# Patient Record
Sex: Female | Born: 1975 | Race: Black or African American | Hispanic: No | Marital: Single | Smoking: Current every day smoker
Health system: Southern US, Community
[De-identification: ages and names within clinical notes are randomized; demographics above are authoritative.]

## PROBLEM LIST (undated history)

## (undated) DIAGNOSIS — F419 Anxiety disorder, unspecified: Secondary | ICD-10-CM

## (undated) DIAGNOSIS — M62838 Other muscle spasm: Secondary | ICD-10-CM

## (undated) DIAGNOSIS — N750 Cyst of Bartholin's gland: Secondary | ICD-10-CM

## (undated) DIAGNOSIS — D649 Anemia, unspecified: Secondary | ICD-10-CM

## (undated) DIAGNOSIS — I10 Essential (primary) hypertension: Secondary | ICD-10-CM

## (undated) DIAGNOSIS — F32A Depression, unspecified: Secondary | ICD-10-CM

## (undated) DIAGNOSIS — K219 Gastro-esophageal reflux disease without esophagitis: Secondary | ICD-10-CM

## (undated) HISTORY — PX: TUBAL LIGATION: SHX77

## (undated) HISTORY — DX: Anemia, unspecified: D64.9

## (undated) HISTORY — DX: Gastro-esophageal reflux disease without esophagitis: K21.9

## (undated) HISTORY — DX: Essential (primary) hypertension: I10

## (undated) HISTORY — DX: Cyst of Bartholin's gland: N75.0

## (undated) HISTORY — DX: Depression, unspecified: F32.A

## (undated) HISTORY — DX: Anxiety disorder, unspecified: F41.9

## (undated) HISTORY — DX: Other muscle spasm: M62.838

---

## 2007-06-04 ENCOUNTER — Emergency Department: Payer: Self-pay | Admitting: General Practice

## 2007-06-08 ENCOUNTER — Ambulatory Visit: Payer: Self-pay | Admitting: Obstetrics and Gynecology

## 2007-06-13 ENCOUNTER — Emergency Department: Payer: Self-pay | Admitting: Emergency Medicine

## 2007-10-22 ENCOUNTER — Emergency Department: Payer: Self-pay | Admitting: Emergency Medicine

## 2008-02-26 ENCOUNTER — Emergency Department: Payer: Self-pay | Admitting: Emergency Medicine

## 2009-06-06 ENCOUNTER — Emergency Department: Payer: Self-pay | Admitting: Emergency Medicine

## 2009-06-24 ENCOUNTER — Emergency Department: Payer: Self-pay | Admitting: Internal Medicine

## 2009-07-07 ENCOUNTER — Ambulatory Visit: Payer: Self-pay | Admitting: Obstetrics and Gynecology

## 2010-12-11 ENCOUNTER — Emergency Department: Payer: Self-pay | Admitting: Emergency Medicine

## 2011-05-21 ENCOUNTER — Emergency Department: Payer: Self-pay | Admitting: Emergency Medicine

## 2011-07-22 ENCOUNTER — Encounter: Payer: Self-pay | Admitting: Obstetrics and Gynecology

## 2011-07-22 ENCOUNTER — Emergency Department: Payer: Self-pay | Admitting: Emergency Medicine

## 2011-09-26 ENCOUNTER — Encounter: Payer: Self-pay | Admitting: Maternal and Fetal Medicine

## 2011-09-26 ENCOUNTER — Observation Stay: Payer: Self-pay | Admitting: Obstetrics and Gynecology

## 2011-12-21 ENCOUNTER — Inpatient Hospital Stay: Payer: Self-pay | Admitting: Obstetrics and Gynecology

## 2011-12-21 LAB — CBC WITH DIFFERENTIAL/PLATELET
Basophil %: 0.4 %
Eosinophil #: 0.1 10*3/uL (ref 0.0–0.7)
Eosinophil %: 0.9 %
HCT: 38.4 % (ref 35.0–47.0)
Lymphocyte #: 2.5 10*3/uL (ref 1.0–3.6)
MCH: 33.5 pg (ref 26.0–34.0)
MCHC: 33.5 g/dL (ref 32.0–36.0)
MCV: 100 fL (ref 80–100)
Monocyte %: 7.5 %
Platelet: 114 10*3/uL — ABNORMAL LOW (ref 150–440)
RDW: 14.6 % — ABNORMAL HIGH (ref 11.5–14.5)
WBC: 10.1 10*3/uL (ref 3.6–11.0)

## 2015-04-09 NOTE — Op Note (Signed)
PATIENT NAMEJULIANNA, [name] MR#:  [id number] DATE OF BIRTH:  01-08-1976  DATE OF PROCEDURE:  12/21/2011  PREOPERATIVE DIAGNOSES:  1. 38.2 week intrauterine pregnancy, undelivered.  2. Advanced maternal age.  3. Increased T21 risk at 1:53.  4. Size greater than dates with borderline hydramnios.  5. Desires elective sterilization.   POSTOPERATIVE DIAGNOSES:  1. 38.2 week intrauterine pregnancy, undelivered.  2. Advanced maternal age.  3. Increased T21 risk at 1:53.  4. Size greater than dates with borderline hydramnios.  5. Desires elective sterilization.  6. Viable female infant, 8 pounds 0 ounces.   OPERATIVE PROCEDURES: Primary low cervical transverse cesarean section with bilateral partial salpingectomy.   SURGEON: Alanda Slim. Zyanna Leisinger, MD  FIRST ASSISTANT: None  ANESTHESIA: Spinal.   INDICATIONS: The patient is a 39 year old African American female gravida 5 para 1-0-3-1, at 38.[redacted] weeks gestation who presented in labor. Pitocin augmentation was performed; amniotomy and internal monitoring with IUPC and FSE was accomplished. Patient had arrest of dilation at 5 cm and -2 station. Patient underwent primary low cervical transverse cesarean section for arrest of dilation and for desire of elective sterilization procedure.   FINDINGS AT SURGERY: Viable female infant 8 pounds 0 ounces having Apgars of 8 and 9 at one and five minutes, respectively. Infant was vigorous at birth. Uterus, tubes, and ovaries were grossly normal. There were a few adhesions between the left adnexa and uterine fundus that were lysed.   DESCRIPTION OF PROCEDURE: Patient was brought to the Operating Room where she was placed in the supine position with a right lateral hip roll in place. The epidural anesthetic was optimized prior to placement on the OR table. Foley catheter was draining clear yellow urine from the bladder. A ChloraPrep abdominal and perineal prep and drape was performed in the standard fashion. After  checking for adequate level of anesthesia, a Pfannenstiel incision was made in the lower abdomen. The fascia was incised with scalpel. The midline raphe was incised, separated and peritoneum was entered. Bladder flap was created over the lower uterine segment through sharp dissection. Low transverse incision was made in the uterus and was extended bluntly bilaterally. The infant was delivered through a vertex presentation and was noted to be vigorous at birth. The umbilical cord was clamped and cut and the infant was handed off to the awaiting resuscitation team. Cord blood sampling was obtained. Placenta was expressed in standard fashion. Uterus was externalized and cleared of all debris with laps. The incision was closed in two layers with #1 chromic suture for the first layer was a running locking stitch, second layer was an imbricating layer. The bilateral partial salpingectomy was then performed in routine fashion. The Babcock clamp was used to grasp the midsegment portion of the tube. A free tie of 0 plain was placed around the tube segment. A stick tie was then placed likewise around the tube segment. The intervening tube segment was resected. Good hemostasis was noted. A similar procedure was carried on the contralateral tube. Upon final completion of the procedure the uterus was placed back into the abdominopelvic cavity. The incision was closed in layers using 0 Maxon on the fascia in a simple running manner. The skin was closed with staples. Pressure dressing was applied. The patient was then mobilized and taken to the recovery room in satisfactory condition. Estimated blood loss was 500 mL. All instruments, needle and sponge counts were verified as correct. Patient did receive Ancef 2 grams antibiotic prophylaxis.  ____________________________ Alanda Slim  Prestyn Mahn, MD mad:cms D: 12/21/2011 22:49:33 ET T: 12/22/2011 14:36:35 ET JOB#: [id number]  cc: Hassell Done A. Kielan Dreisbach, MD, <Dictator> Alanda Slim  Aspin Palomarez MD ELECTRONICALLY SIGNED 01/13/2012 12:41
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
## 2015-05-03 ENCOUNTER — Emergency Department
Admission: EM | Admit: 2015-05-03 | Discharge: 2015-05-03 | Disposition: A | Discharge status: Home / Self Care | Payer: Managed Care, Other (non HMO) | Attending: Student | Admitting: Student

## 2015-05-03 ENCOUNTER — Emergency Department: Payer: Managed Care, Other (non HMO)

## 2015-05-03 DIAGNOSIS — T148 Other injury of unspecified body region: Secondary | ICD-10-CM | POA: Insufficient documentation

## 2015-05-03 DIAGNOSIS — Y9301 Activity, walking, marching and hiking: Secondary | ICD-10-CM | POA: Insufficient documentation

## 2015-05-03 DIAGNOSIS — W010XXA Fall on same level from slipping, tripping and stumbling without subsequent striking against object, initial encounter: Secondary | ICD-10-CM | POA: Diagnosis not present

## 2015-05-03 DIAGNOSIS — S8992XA Unspecified injury of left lower leg, initial encounter: Secondary | ICD-10-CM | POA: Diagnosis not present

## 2015-05-03 DIAGNOSIS — S6992XA Unspecified injury of left wrist, hand and finger(s), initial encounter: Secondary | ICD-10-CM | POA: Insufficient documentation

## 2015-05-03 DIAGNOSIS — Z720 Tobacco use: Secondary | ICD-10-CM | POA: Diagnosis not present

## 2015-05-03 DIAGNOSIS — S8991XA Unspecified injury of right lower leg, initial encounter: Secondary | ICD-10-CM | POA: Diagnosis not present

## 2015-05-03 DIAGNOSIS — M25551 Pain in right hip: Secondary | ICD-10-CM

## 2015-05-03 DIAGNOSIS — S76011A Strain of muscle, fascia and tendon of right hip, initial encounter: Secondary | ICD-10-CM | POA: Diagnosis not present

## 2015-05-03 DIAGNOSIS — Y9289 Other specified places as the place of occurrence of the external cause: Secondary | ICD-10-CM | POA: Insufficient documentation

## 2015-05-03 DIAGNOSIS — T148XXA Other injury of unspecified body region, initial encounter: Secondary | ICD-10-CM

## 2015-05-03 DIAGNOSIS — S79911A Unspecified injury of right hip, initial encounter: Secondary | ICD-10-CM | POA: Diagnosis present

## 2015-05-03 DIAGNOSIS — Y998 Other external cause status: Secondary | ICD-10-CM | POA: Diagnosis not present

## 2015-05-03 MED ORDER — CYCLOBENZAPRINE HCL 5 MG PO TABS: 5.0000 mg | ORAL_TABLET | Freq: Three times a day (TID) | ORAL | Status: DC | PRN

## 2015-05-03 MED ORDER — IBUPROFEN 200 MG PO TABS
400.0000 mg | ORAL_TABLET | Freq: Three times a day (TID) | ORAL | Status: DC | PRN
Start: 2015-05-03 — End: 2015-10-24

## 2015-05-03 NOTE — ED Notes (Signed)
Fall X 3 days ago, right leg pain since, progressively worsening. Did not hit head in fall. Pt alert and oriented X4, active, cooperative, pt in NAD. RR even and unlabored, color WNL.

## 2015-05-03 NOTE — Discharge Instructions (Signed)
Take medication as prescribed. Alternate ice and heat for comfort. Stretch well multiple times per day.  Follow-up with primary care physician next week for the above.  Return to the ER for new or worsening concerns.  Hip Pain Your hip is the joint between your upper legs and your lower pelvis. The bones, cartilage, tendons, and muscles of your hip joint perform a lot of work each day supporting your body weight and allowing you to move around. Hip pain can range from a minor ache to severe pain in one or both of your hips. Pain may be felt on the inside of the hip joint near the groin, or the outside near the buttocks and upper thigh. You may have swelling or stiffness as well.  HOME CARE INSTRUCTIONS   Take medicines only as directed by your health care provider.  Apply ice to the injured area:  Put ice in a plastic bag.  Place a towel between your skin and the bag.  Leave the ice on for 15-20 minutes at a time, 3-4 times a day.  Keep your leg raised (elevated) when possible to lessen swelling.  Avoid activities that cause pain.  Follow specific exercises as directed by your health care provider.  Sleep with a pillow between your legs on your most comfortable side.  Record how often you have hip pain, the location of the pain, and what it feels like. SEEK MEDICAL CARE IF:   You are unable to put weight on your leg.  Your hip is red or swollen or very tender to touch.  Your pain or swelling continues or worsens after 1 week.  You have increasing difficulty walking.  You have a fever. SEEK IMMEDIATE MEDICAL CARE IF:   You have fallen.  You have a sudden increase in pain and swelling in your hip. MAKE SURE YOU:   Understand these instructions.  Will watch your condition.  Will get help right away if you are not doing well or get worse. Document Released: 05/22/2010 Document Revised: 04/18/2014 Document Reviewed: 07/29/2013 [facility] Patient Information 2015  [place], [place]. This information is not intended to replace advice given to you by your health care provider. Make sure you discuss any questions you have with your health care provider.  Gluteus Medius Syndrome with Rehab Gluteus medius syndrome is a condition that causes pain and inflammation on the outer portion of the hip. Gluteus medius syndrome is caused by a muscle tear (strain) in the muscle or tendon in the gluteus medius muscle. The gluteus medius muscle is responsible for moving the thigh away from the other thigh (abducting), as well as stabilizing the hip while walking, running, and jumping. Gluteus medius syndrome usually involves a grade 1 or 2 strain of the muscle or tendon. Grade 1 strains cause pain, but the tendon is not lengthened. Grade 2 strains include a lengthened ligament due to the ligament being stretched or partially ruptured. With grade 2 strains there is still function, although the function may be decreased.  SYMPTOMS   Pain, and often a limp, with walking or running.  Tenderness over the outer hip.  Pain, tenderness, swelling, warmth, or redness over the outer thigh, often worsened by moving the hip.  Often, weakness of the hip.  Pain or weakness that gets worse when outwardly moving the thigh. CAUSES  Gluteus medius syndrome may be caused by either a severe (acute) or ongoing (chronic) injury. These injuries are often due to a sudden increase in the intensity, frequency,  or duration of training. Typically, this condition is associated with tilting of the pelvis with running.  RISK INCREASES WITH:  Endurance sports (distance running, triathlons, or race walking), especially running along street curbs and/or banked surfaces. Or if the foot crosses the midline toward the other leg when running.  Poor strength and flexibility.  Failure to warm up properly before activity.  Legs of unequal length (affects longer leg).  Alignment problems of the lower legs,  including wide pelvis and excessively knocked knees. PREVENTION   Warm up and stretch properly before activity.  Maintain physical fitness:  Strength, flexibility, and endurance.  Cardiovascular fitness.  Learn and use proper running technique.  Wear shoe lifts (orthotics) if legs are not equal in length. PROGNOSIS  If treated properly, gluteus medius syndrome usually heals within 2 to 6 weeks.  RELATED COMPLICATIONS   Prolonged healing time, if not properly treated, or if not given adequate time to heal.  Chronically inflamed tendon, causing persistent pain with activity that may progress to constant pain.  Recurring symptoms if activity is resumed too soon, with overuse, with a direct blow, or if using poor technique. TREATMENT  Treatment first involves the use of ice and medicine to help reduce pain and inflammation. It is important to complete strengthening and stretching exercises, as well as modify any activity that aggravates symptoms. These exercises may be completed at home or with a therapist. For people with legs that are unequal in length, a shoe lift (orthotic) may be recommended. Rarely, surgery is needed and is only considered after more than 6 months of unsuccessful nonsurgical treatment. MEDICATION  If pain medicine is needed, nonsteroidal anti-inflammatory medications (aspirin and ibuprofen), or other minor pain relievers (acetaminophen), are often recommended.  Do not take pain medicine for 7 days before surgery.  Prescription pain relievers may be given if your caregiver thinks they are needed. Use only as directed and only as much as you need.  Corticosteroid injections may be recommended. However, these injections should only be used for serious cases, as they can only be given a certain number of times. HEAT AND COLD  Cold treatment (icing) relieves pain and reduces inflammation. Cold treatment should be applied for 10 to 15 minutes every 2 to 3 hours, and  immediately after activity that aggravates your symptoms. Use ice packs or an ice massage.  Heat treatment may be used before performing the stretching and strengthening activities prescribed by your caregiver, physical therapist, or athletic trainer. Use a heat pack or a warm water soak. SEEK MEDICAL CARE IF:  Symptoms get worse or do not improve in 2 weeks, despite treatment.  New, unexplained symptoms develop. (Drugs used in treatment may produce side effects.) EXERCISES  RANGE OF MOTION (ROM) AND STRETCHING EXERCISES - Gluteus Medius Syndrome These exercises may help you when beginning to rehabilitate your injury. Your symptoms may go away with or without further involvement from your physician, physical therapist, or athletic trainer. While completing these exercises, remember:   Restoring tissue flexibility helps normal motion to return to the joints. This allows healthier, less painful movement and activity.  An effective stretch should be held for at least 30 seconds.  A stretch should never be painful. You should only feel a gentle lengthening or release in the stretched tissue. STRETCH - Hip Rotators   Lie on your back on a firm surface. Grasp your right / left knee with your right / left hand and your ankle with your opposite hand.  Keeping  your hips and shoulders firmly planted, gently pull your right / left knee, and rotate your lower leg toward your opposite shoulder until you feel a stretch in your buttocks.  Hold this stretch for __________ seconds. Repeat this stretch __________ times. Complete this stretch __________ times per day. STRETCH - Iliotibial Band  On the floor or bed, lie on your side so your right / left leg is on top. Bend your knee and grab your ankle.  Slowly bring your knee back, so that your thigh is in line with your trunk. Keep your heel at your buttocks and gently arch your back so your head, shoulders and hips line up.  Slowly lower your leg so  that your knee approaches the floor or bed, until you feel a gentle stretch on the outside of your right / left thigh. If you do not feel a stretch and your knee will not fall farther, place the heel of your opposite foot on top of your knee and pull your thigh down farther.  Hold this stretch for __________ seconds. Repeat __________ times. Complete __________ times per day. STRENGTHENING EXERCISES - Gluteus Medius Syndrome These exercises may help you when beginning to rehabilitate your injury. They may resolve your symptoms with or without further involvement from your physician, physical therapist, or athletic trainer. While completing these exercises, remember:   Muscles can gain both the endurance and the strength needed for everyday activities through controlled exercises.  Complete these exercises as instructed by your physician, physical therapist, or athletic trainer. Increase the resistance and repetitions only as guided.  You may experience muscle soreness or fatigue, but the pain or discomfort you are trying to eliminate should never worsen during these exercises. If this pain does get worse, stop and make certain you are following the directions exactly. If the pain is still present after adjustments, discontinue the exercise until you can discuss the trouble with your clinician. STRENGTH - Hip Extensors, Bridge   Lie on your back on a firm surface. Bend your knees and place your feet flat on the floor.  Tighten your buttocks muscles and lift your bottom off the floor, until your trunk is level with your thighs. You should feel the muscles in your buttocks and the back of your thighs working. If you do not feel these muscles, slide your feet 1-2 inches further away from your buttocks.  Hold this position for __________ seconds.  Slowly lower your hips to the starting position, and allow your buttock muscles to relax completely before beginning the next repetition.  If this  exercise is too easy, you may cross your arms over your chest. Repeat __________ times. Complete this exercise __________ times per day.  STRENGTH - Hip Abductors, Straight Leg Raises  Be aware of your form throughout the entire exercise, so that you exercise the correct muscles. Poor form means that you are not strengthening the correct muscles.  Lie on your side so that your head, shoulders, knee, and hip line up. You may bend your lower knee to help maintain your balance. Your right / left leg should be on top.  Roll your hips slightly forward, so that your hips are stacked directly over each other and your right / left knee is facing forward.  Lift your top leg up 4-6 inches, leading with your heel. Be sure that your foot does not drift forward and that your knee does not roll toward the ceiling.  Hold this position for __________ seconds. You should  feel the muscles in your outer hip lifting (you may not notice this until your leg begins to tire).  Slowly lower your leg to the starting position. Allow the muscles to fully relax before beginning the next repetition. Repeat __________ times. Complete this exercise __________ times per day.  STRENGTH - Hip Abductors, Quadruped  On a firm, lightly padded surface, position yourself on your hands and knees. Your hands should be directly below your shoulders and your knees should be directly below your hips.  Keeping your right / left knee bent, lift your leg out to the side. Keep your legs level and in line with your shoulders.  Hold for __________ seconds.  Keeping your trunk steady and your hips level, slowly lower your leg to the starting position. Repeat __________ times. Complete this exercise __________ times per day.  STRENGTH - Hip Abductors, Standing Be aware of your form throughout the entire exercise, so that you exercise the correct muscles. Poor form means that you are not strengthening the correct muscles.  Tie one end of a  rubber exercise band or tubing to a secure surface (table, pole) and tie a loop at the other end.  Place the loop around your right / left ankle. Turn your body sideways so that your opposite side faces the table or pole and your right / left leg away from the table or pole. Step away from the pole or table, until there is tension in the band.  Hold onto a chair, as needed, for balance.  Keeping your back upright, your shoulders over your hips, and your toes pointing forward, lift your right / left leg out to your side. Be sure to lift your leg with your hip muscles. Do not "throw" your leg or tip your body to lift your leg.  Slowly and with control, return to the starting position. Repeat exercise __________ times. Complete this exercise __________ times per day.  Document Released: 12/02/2005 Document Revised: 04/18/2014 Document Reviewed: 03/16/2009 [facility] Patient Information 2015 [place], [place]. This information is not intended to replace advice given to you by your health care provider. Make sure you discuss any questions you have with your health care provider.

## 2015-05-03 NOTE — ED Provider Notes (Signed)
[facility] Emergency Department Provider Note ____________________________________________  Time seen: Approximately 1200 PM  I have reviewed the triage vital signs and the nursing notes.   HISTORY  Chief Complaint Fall and Leg Pain    HPI Carrie [name] is a 39 y.o. female presents to the ER for right hip pain. Patient states that 3 days ago she was walking at her mom's house went to step up on the curb and tripped over the curb edge.  states she then fell forward and caught herself with left hand and fell onto the left knee. patient denies pain to the left hand or left knee but reports since fall she has had pain to right hip. Denies head injury or loss of consciousness. Denies neck pain or back pain.   Patient reports right hip pain as 5 out of 10 aching and feels like a pulled muscle. States movement increases pain. States when at rest and lying on her back she has minimal pain. Patient reports movement of right leg increases pain. Denies any other fall or injury.   History reviewed. No pertinent past medical history.  There are no active problems to display for this patient.   Past Surgical History  Procedure Laterality Date  . Cesarean section      No current outpatient prescriptions on file.  Allergies Review of patient's allergies indicates no known allergies.  No family history on file.  Social History History  Substance Use Topics  . Smoking status: Current Every Day Smoker  . Smokeless tobacco: Not on file  . Alcohol Use: No    Review of Systems Constitutional: No fever/chills Eyes: No visual changes. ENT: No sore throat. Cardiovascular: Denies chest pain. Respiratory: Denies shortness of breath. Gastrointestinal: No abdominal pain.  No nausea, no vomiting.  No diarrhea.  No constipation. Genitourinary: Negative for dysuria. Musculoskeletal: Negative for back pain.positive for right hip pain as above Skin: Negative for  rash. Neurological: Negative for headaches, focal weakness or numbness.  10-point ROS otherwise negative.  ____________________________________________   PHYSICAL EXAM:  VITAL SIGNS: ED Triage Vitals  Enc Vitals Group     BP 05/03/15 1124 132/97 mmHg     Pulse Rate 05/03/15 1124 91     Resp 05/03/15 1124 16     Temp 05/03/15 1124 98.5 F (36.9 C)     Temp Source 05/03/15 1124 Oral     SpO2 05/03/15 1124 100 %     Weight 05/03/15 1124 100 lb (45.36 kg)     Height 05/03/15 1124 5' (1.524 m)     Head Cir --      Peak Flow --      Pain Score 05/03/15 1124 8     Pain Loc --      Pain Edu? --      Excl. in [place]? --     Constitutional: Alert and oriented. Well appearing and in no acute distress. Eyes: Conjunctivae are normal. PERRL. EOMI. Head: Atraumatic. Nose: No congestion/rhinnorhea. Mouth/Throat: Mucous membranes are moist.  Oropharynx non-erythematous. Neck: No stridor.  No cervical spine tenderness to palpation. Hematological/Lymphatic/Immunilogical: No cervical lymphadenopathy. Cardiovascular: Normal rate, regular rhythm. Grossly normal heart sounds.  Good peripheral circulation. Respiratory: Normal respiratory effort.  No retractions. Lungs CTAB. Gastrointestinal: Soft and nontender. No distention. No abdominal bruits. No CVA tenderness. Musculoskeletal: No lower extremity tenderness nor edema.  No joint effusions. Except: Right lateral hip and lateral gluteus mild TTP. Pain increased with palpation and right hip abduction. No swelling,  erythema, ecchymosis.  Neurologic:  Normal speech and language. No gross focal neurologic deficits are appreciated. Speech is normal. No gait instability. Skin:  Skin is warm, dry and intact. No rash noted. Psychiatric: Mood and affect are normal. Speech and behavior are normal.  ____________________________________________   LABS (all labs ordered are listed, but only abnormal results are displayed)  Labs Reviewed - No data to  display ____________________________________________   RADIOLOGY  RIGHT HIP (WITH PELVIS) 2-3 VIEWS  COMPARISON: None.  FINDINGS: There is no evidence of hip fracture or dislocation. There is no evidence of arthropathy or other focal bone abnormality.  IMPRESSION: No acute abnormality noted.   Electronically Signed By: Inez Catalina M.D. On: 05/03/2015 13:14 ____________________________________________   INITIAL IMPRESSION / ASSESSMENT AND PLAN / ED COURSE  Pertinent labs & imaging results that were available during my care of the patient were reviewed by me and considered in my medical decision making (see chart for details).  Very well-appearing patient. Changes positions quickly from lying to standing. No acute distress. Steady gait. Complains of right lateral pain at [place] with movement. Recent fall but not directly in this area. X-ray negative for acute bony abnormality. Patient with point muscular tenderness. Suspect muscular strain. Will treat with anti-inflammatories and as needed muscle relaxants. Stretching. Patient followed primary care physician as needed discussed above and return parameters.Pt agreed to plan.  ____________________________________________   FINAL CLINICAL IMPRESSION(S) / ED DIAGNOSES  Final diagnoses:  Right hip pain  Muscle strain      Marylene Land, NP 05/03/15 1357  Joanne Gavel, MD 05/03/15 1447

## 2015-06-30 ENCOUNTER — Emergency Department
Admission: EM | Admit: 2015-06-30 | Discharge: 2015-06-30 | Disposition: A | Discharge status: Home / Self Care | Payer: Managed Care, Other (non HMO) | Attending: Emergency Medicine | Admitting: Emergency Medicine

## 2015-06-30 ENCOUNTER — Encounter: Payer: Self-pay | Admitting: *Deleted

## 2015-06-30 DIAGNOSIS — N751 Abscess of Bartholin's gland: Secondary | ICD-10-CM | POA: Insufficient documentation

## 2015-06-30 DIAGNOSIS — Z720 Tobacco use: Secondary | ICD-10-CM | POA: Diagnosis not present

## 2015-06-30 DIAGNOSIS — R102 Pelvic and perineal pain: Secondary | ICD-10-CM | POA: Diagnosis present

## 2015-06-30 MED ORDER — CLINDAMYCIN HCL 300 MG PO CAPS: 300.0000 mg | ORAL_CAPSULE | Freq: Three times a day (TID) | ORAL | Status: AC

## 2015-06-30 MED ORDER — OXYCODONE-ACETAMINOPHEN 5-325 MG PO TABS
1.0000 | ORAL_TABLET | Freq: Once | ORAL | Status: AC
  Administered 2015-06-30: 1 via ORAL
  Filled 2015-06-30: qty 1

## 2015-06-30 MED ORDER — LIDOCAINE HCL (PF) 1 % IJ SOLN
INTRAMUSCULAR | Status: AC
  Filled 2015-06-30: qty 5

## 2015-06-30 MED ORDER — KETOROLAC TROMETHAMINE 30 MG/ML IJ SOLN: 30.0000 mg | Freq: Once | INTRAMUSCULAR | Status: DC

## 2015-06-30 MED ORDER — OXYCODONE-ACETAMINOPHEN 5-325 MG PO TABS: 1.0000 | ORAL_TABLET | ORAL | Status: DC | PRN

## 2015-06-30 MED ORDER — CLINDAMYCIN HCL 150 MG PO CAPS
300.0000 mg | ORAL_CAPSULE | Freq: Once | ORAL | Status: AC
  Administered 2015-06-30: 300 mg via ORAL
  Filled 2015-06-30: qty 2

## 2015-06-30 NOTE — ED Notes (Signed)
Called pharmacy to send pt medication. 

## 2015-06-30 NOTE — ED Notes (Signed)
Pt reports she has an abscess on her left labial area she noticed about 2 days ago, denies drainage, states low grade temp and cold chills.

## 2015-06-30 NOTE — ED Provider Notes (Signed)
[facility] Emergency Department Provider Note  ____________________________________________  Time seen: 2:30 AM  I have reviewed the triage vital signs and the nursing notes.   HISTORY  Chief Complaint Abscess      HPI Carrie [name] is a 39 y.o. female resents with "abscess site her vagina". Patient stated she noted a tender swollen area to the left portion of her vagina 2 days. Patient also admits to a low-grade ejected fever and chills at home. Patient admits to previous episodes of Bartholin's cyst/abscess 2 occasions     Past medical history Bartholin's abscess   Past Surgical History  Procedure Laterality Date  . Cesarean section      Current Outpatient Rx  Name  Route  Sig  Dispense  Refill  . cyclobenzaprine (FLEXERIL) 5 MG tablet   Oral   Take 1 tablet (5 mg total) by mouth every 8 (eight) hours as needed for muscle spasms (or pain. Do not drive or operate machinery while taking as can cause drowsiness.).   12 tablet   0   . ibuprofen (MOTRIN IB) 200 MG tablet   Oral   Take 2 tablets (400 mg total) by mouth every 8 (eight) hours as needed for mild pain or moderate pain.   15 tablet   0     Allergies Review of patient's allergies indicates no known allergies.  No family history on file.  Social History History  Substance Use Topics  . Smoking status: Current Every Day Smoker  . Smokeless tobacco: Not on file  . Alcohol Use: No    Review of Systems  Constitutional: Negative for fever. Eyes: Negative for visual changes. ENT: Negative for sore throat. Cardiovascular: Negative for chest pain. Respiratory: Negative for shortness of breath. Gastrointestinal: Negative for abdominal pain, vomiting and diarrhea. Genitourinary: Negative for dysuria. Musculoskeletal: Negative for back pain. Skin: Positive for abscess beside vagina Neurological: Negative for headaches, focal weakness or numbness.   10-point ROS  otherwise negative.  ____________________________________________   PHYSICAL EXAM:  VITAL SIGNS: ED Triage Vitals  Enc Vitals Group     BP 06/30/15 0102 119/79 mmHg     Pulse Rate 06/30/15 0102 77     Resp 06/30/15 0102 16     Temp 06/30/15 0102 98.1 F (36.7 C)     Temp Source 06/30/15 0102 Oral     SpO2 06/30/15 0102 100 %     Weight 06/30/15 0102 98 lb (44.453 kg)     Height 06/30/15 0102 5' (1.524 m)     Head Cir --      Peak Flow --      Pain Score 06/30/15 0102 8     Pain Loc --      Pain Edu? --      Excl. in [place]? --    Constitutional: Alert and oriented. Well appearing and in no distress. Eyes: Conjunctivae are normal. PERRL. Normal extraocular movements. ENT   Head: Normocephalic and atraumatic.   Nose: No congestion/rhinnorhea.   Mouth/Throat: Mucous membranes are moist.   Neck: No stridor. Cardiovascular: Normal rate, regular rhythm. Normal and symmetric distal pulses are present in all extremities. No murmurs, rubs, or gallops. Respiratory: Normal respiratory effort without tachypnea nor retractions. Breath sounds are clear and equal bilaterally. No wheezes/rales/rhonchi. Gastrointestinal: Soft and nontender. No distention. There is no CVA tenderness. Genitourinary: Tender swollen area noted in the vicinity of the Bartholin's gland on the left consistent with Bartholin abscess. Musculoskeletal: Nontender with normal range of  motion in all extremities. No joint effusions.  No lower extremity tenderness nor edema. Neurologic:  Normal speech and language. No gross focal neurologic deficits are appreciated. Speech is normal.  Skin:  Skin is warm, dry and intact. No rash noted. Psychiatric: Mood and affect are normal. Speech and behavior are normal. Patient exhibits appropriate insight and judgment.  ____________________________________________   Procedure note: INCISION AND DRAINAGE Performed by: Gregor Hams Consent: Verbal consent  obtained. Risks and benefits: risks, benefits and alternatives were discussed Type: abscess  Body area: left vaginal region (Bartholin's gland (  Anesthesia: local infiltration  Incision was made with a scalpel.  Local anesthetic: lidocaine 1 %   Anesthetic total:64ml  Complexity: complex Blunt dissection to break up loculations  Drainage: purulent  Drainage amount: 10 cc   Packing material: 1/4 in iodoform gauze  Patient tolerance: Patient tolerated the procedure well with no immediate complications.    INITIAL IMPRESSION / ASSESSMENT AND PLAN / ED COURSE  Pertinent labs & imaging results that were available during my care of the patient were reviewed by me and considered in my medical decision making (see chart for details).    history of physical exam consistent with Bartholin gland abscess as such I&D was performed with approximately 10 ML's of pus expressed. ____________________________________________   FINAL CLINICAL IMPRESSION(S) / ED DIAGNOSES  Final diagnoses:  Bartholin's gland abscess      Gregor Hams, MD 07/04/15 [phone]

## 2015-06-30 NOTE — Discharge Instructions (Signed)
Bartholin's Cyst or Abscess °Bartholin's glands are small glands located within the folds of skin (labia) along the sides of the lower opening of the vagina (birth canal). A cyst may develop when the duct of the gland becomes blocked. When this happens, fluid that accumulates within the cyst can become infected. This is known as an abscess. The Bartholin gland produces a mucous fluid to lubricate the outside of the vagina during sexual intercourse. °SYMPTOMS  °· Patients with a small cyst may not have any symptoms. °· Mild discomfort to severe pain depending on the size of the cyst and if it is infected (abscess). °· Pain, redness, and swelling around the lower opening of the vagina. °· Painful intercourse. °· Pressure in the perineal area. °· Swelling of the lips of the vagina (labia). °· The cyst or abscess can be on one side or both sides of the vagina. °DIAGNOSIS  °· A large swelling is seen in the lower vagina area by your caregiver. °· Painful to touch. °· Redness and pain, if it is an abscess. °TREATMENT  °· Sometimes the cyst will go away on its own. °· Apply warm wet compresses to the area or take hot sitz baths several times a day. °· An incision to drain the cyst or abscess with local anesthesia. °· Culture the pus, if it is an abscess. °· Antibiotic treatment, if it is an abscess. °· Cut open the gland and suture the edges to make the opening of the gland bigger (marsupialization). °· Remove the whole gland if the cyst or abscess returns. °PREVENTION  °· Practice good hygiene. °· Clean the vaginal area with a mild soap and soft cloth when bathing. °· Do not rub hard in the vaginal area when bathing. °· Protect the crotch area with a padded cushion if you take long bike rides or ride horses. °· Be sure you are well lubricated when you have sexual intercourse. °HOME CARE INSTRUCTIONS  °· If your cyst or abscess was opened, a small piece of gauze, or a drain, may have been placed in the wound to allow  drainage. Do not remove this gauze or drain unless directed by your caregiver. °· Wear feminine pads, not tampons, as needed for any drainage or bleeding. °· If antibiotics were prescribed, take them exactly as directed. Finish the entire course. °· Only take over-the-counter or prescription medicines for pain, discomfort, or fever as directed by your caregiver. °SEEK IMMEDIATE MEDICAL CARE IF:  °· You have an increase in pain, redness, swelling, or drainage. °· You have bleeding from the wound which results in the use of more than the number of pads suggested by your caregiver in 24 hours. °· You have chills. °· You have a fever. °· You develop any new problems (symptoms) or aggravation of your existing condition. °MAKE SURE YOU:  °· Understand these instructions. °· Will watch your condition. °· Will get help right away if you are not doing well or get worse. °Document Released: 12/02/2005 Document Revised: 02/24/2012 Document Reviewed: 07/20/2008 °ExitCare® Patient Information ©2015 [employer]. This information is not intended to replace advice given to you by your health care provider. Make sure you discuss any questions you have with your health care provider. ° °

## 2015-08-25 ENCOUNTER — Emergency Department
Admission: EM | Admit: 2015-08-25 | Discharge: 2015-08-25 | Disposition: A | Discharge status: Home / Self Care | Payer: Managed Care, Other (non HMO) | Attending: Student | Admitting: Student

## 2015-08-25 DIAGNOSIS — N764 Abscess of vulva: Secondary | ICD-10-CM | POA: Diagnosis not present

## 2015-08-25 DIAGNOSIS — Z9889 Other specified postprocedural states: Secondary | ICD-10-CM

## 2015-08-25 DIAGNOSIS — Z720 Tobacco use: Secondary | ICD-10-CM | POA: Insufficient documentation

## 2015-08-25 DIAGNOSIS — L729 Follicular cyst of the skin and subcutaneous tissue, unspecified: Secondary | ICD-10-CM | POA: Diagnosis not present

## 2015-08-25 MED ORDER — CLINDAMYCIN HCL 150 MG PO CAPS
300.0000 mg | ORAL_CAPSULE | Freq: Once | ORAL | Status: AC
  Administered 2015-08-25: 300 mg via ORAL
  Filled 2015-08-25: qty 2

## 2015-08-25 MED ORDER — CLINDAMYCIN HCL 300 MG PO CAPS: 300.0000 mg | ORAL_CAPSULE | Freq: Four times a day (QID) | ORAL | Status: DC

## 2015-08-25 MED ORDER — TRAMADOL HCL 50 MG PO TABS: 50.0000 mg | ORAL_TABLET | Freq: Two times a day (BID) | ORAL | Status: DC

## 2015-08-25 MED ORDER — TRAMADOL HCL 50 MG PO TABS
50.0000 mg | ORAL_TABLET | Freq: Once | ORAL | Status: AC
  Administered 2015-08-25: 50 mg via ORAL
  Filled 2015-08-25: qty 1

## 2015-08-25 MED ORDER — LIDOCAINE-EPINEPHRINE (PF) 1 %-1:200000 IJ SOLN
30.0000 mL | Freq: Once | INTRAMUSCULAR | Status: AC
  Administered 2015-08-25: 30 mL via INTRADERMAL
  Filled 2015-08-25: qty 30

## 2015-08-25 NOTE — ED Provider Notes (Signed)
[facility] Emergency Department Provider Note ____________________________________________  Time seen: 2045  I have reviewed the triage vital signs and the nursing notes.  HISTORY  Chief Complaint  Cyst  HPI Carrie [name] is a 39 y.o. female reports to the ED for eval which management of a vulvar abscess is been present for the last 3 days. She is been applying warm compresses to help promote healing. She has previously experienced or abscesses that required incision and drainage. She denies any current fevers, chills, or sweats. She is scheduled to see her provider at [facility] when she returns from out of town next week.  No past medical history on file.  There are no active problems to display for this patient.   Past Surgical History  Procedure Laterality Date  . Cesarean section      Current Outpatient Rx  Name  Route  Sig  Dispense  Refill  . clindamycin (CLEOCIN) 300 MG capsule   Oral   Take 1 capsule (300 mg total) by mouth 4 (four) times daily.   40 capsule   0   . cyclobenzaprine (FLEXERIL) 5 MG tablet   Oral   Take 1 tablet (5 mg total) by mouth every 8 (eight) hours as needed for muscle spasms (or pain. Do not drive or operate machinery while taking as can cause drowsiness.).   12 tablet   0   . ibuprofen (MOTRIN IB) 200 MG tablet   Oral   Take 2 tablets (400 mg total) by mouth every 8 (eight) hours as needed for mild pain or moderate pain.   15 tablet   0   . oxyCODONE-acetaminophen (PERCOCET/ROXICET) 5-325 MG per tablet   Oral   Take 1 tablet by mouth every 4 (four) hours as needed for severe pain.   20 tablet   0   . traMADol (ULTRAM) 50 MG tablet   Oral   Take 1 tablet (50 mg total) by mouth 2 (two) times daily.   10 tablet   0    Allergies Review of patient's allergies indicates no known allergies.  No family history on file.  Social History Social History  Substance Use Topics  . Smoking status:  Current Every Day Smoker  . Smokeless tobacco: Not on file  . Alcohol Use: No   Review of Systems  Constitutional: Negative for fever. Eyes: Negative for visual changes. ENT: Negative for sore throat. Cardiovascular: Negative for chest pain. Respiratory: Negative for shortness of breath. Gastrointestinal: Negative for abdominal pain, vomiting and diarrhea. Genitourinary: Negative for dysuria. Vulvar abscess as above Musculoskeletal: Negative for back pain. Skin: Negative for rash. Neurological: Negative for headaches, focal weakness or numbness. ____________________________________________  PHYSICAL EXAM:  VITAL SIGNS: ED Triage Vitals  Enc Vitals Group     BP 08/25/15 1914 113/79 mmHg     Pulse Rate 08/25/15 1914 81     Resp 08/25/15 1914 18     Temp 08/25/15 1914 97.4 F (36.3 C)     Temp Source 08/25/15 1914 Oral     SpO2 08/25/15 1914 100 %     Weight 08/25/15 1914 97 lb (43.999 kg)     Height 08/25/15 1914 5' (1.524 m)     Head Cir --      Peak Flow --      Pain Score 08/25/15 1916 8     Pain Loc --      Pain Edu? --      Excl. in [place]? --  Constitutional: Alert and oriented. Well appearing and in no distress. Eyes: Conjunctivae are normal. PERRL. Normal extraocular movements. ENT   Head: Normocephalic and atraumatic.   Nose: No congestion/rhinorrhea.   Mouth/Throat: Mucous membranes are moist.   Neck: Supple. No thyromegaly. Hematological/Lymphatic/Immunological: No cervical lymphadenopathy. Cardiovascular: Normal rate, regular rhythm.  Respiratory: Normal respiratory effort. No wheezes/rales/rhonchi. Gastrointestinal: Soft and nontender. No distention. GU:  Musculoskeletal: Nontender with normal range of motion in all extremities.  Neurologic:  Normal gait without ataxia. Normal speech and language. No gross focal neurologic deficits are appreciated. Skin:  Skin is warm, dry and intact. No rash noted. Psychiatric: Mood and affect are normal.  Patient exhibits appropriate insight and judgment. ____________________________________________  PROCEDURES Ultram 50 mg PO Cleocin 300 mg PO  INCISION AND DRAINAGE Performed by: Melvenia Needles Consent: Verbal consent obtained. Risks and benefits: risks, benefits and alternatives were discussed Type: abscess  Body area: Left labia  Anesthesia: local infiltration  Incision was made with a scalpel.  Local anesthetic: lidocaine 1% w/ epinephrine  Anesthetic total: 6 ml  Complexity: complex Blunt dissection to break up loculations  Drainage: purulent  Drainage amount: 5 ml  Packing material: 1/4 in iodoform gauze  Patient tolerance: Patient tolerated the procedure well with no immediate complications. ____________________________________________  INITIAL IMPRESSION / ASSESSMENT AND PLAN / ED COURSE  Vulvar abscess status post I&D with packing. Patient discharged home with clindamycin the dose qid for infection, and Ultram for pain. Return to the ED in 3 days for wound check and packing removal. Continue warm compresses and follow with primary care provider as needed. ____________________________________________  FINAL CLINICAL IMPRESSION(S) / ED DIAGNOSES  Final diagnoses:  Vulvar abscess  Status post incision and drainage     Melvenia Needles, PA-C 08/26/15 0012  Joanne Gavel, MD 08/27/15 [id number]

## 2015-08-25 NOTE — Discharge Instructions (Signed)
Vulvar Abscess  The vulva is made up of the large and small flaps of skin around the vagina opening. A vulvar abscess is an infected sac of yellowish white fluid (pus) in the skin flaps. Your doctor may make a small cut in the skin to drain the vulvar abscess.  HOME CARE  Only take medicine as told by your doctor.  Soak or take a warm water bath (sitz bath) 3 to 4 times a day for 15 to 20 minutes.  After you pee (urinate), always wipe from front to back.  Clean the vulvar abscess with soap and warm water. Do this after going to the bathroom.  Wear loose-fitting clothing.  Do not have sex until the vulvar abscess is gone or as told by your doctor. GET HELP RIGHT AWAY IF:   You have a temperature by mouth above 102 F (38.9 C).  The vulva area becomes more painful, puffy (swollen), or red.  You have fluid coming from the vulva area that is red or tan.  You have pain when you pee or have a hard time peeing. MAKE SURE YOU:  Understand these instructions.  Will watch your condition.  Will get help if you are not doing well or get worse. Document Released: 02/28/2009 Document Revised: 02/24/2012 Document Reviewed: 02/28/2009 [facility] Patient Information 2015 [place], [place]. This information is not intended to replace advice given to you by your health care provider. Make sure you discuss any questions you have with your health care provider.  Incision and Drainage Incision and drainage is a procedure in which a sac-like structure (cystic structure) is opened and drained. The area to be drained usually contains material such as pus, fluid, or blood.  LET YOUR CAREGIVER KNOW ABOUT:   Allergies to medicine.  Medicines taken, including vitamins, herbs, eyedrops, over-the-counter medicines, and creams.  Use of steroids (by mouth or creams).  Previous problems with anesthetics or numbing medicines.  History of bleeding problems or blood clots.  Previous surgery.  Other health  problems, including diabetes and kidney problems.  Possibility of pregnancy, if this applies. RISKS AND COMPLICATIONS  Pain.  Bleeding.  Scarring.  Infection. BEFORE THE PROCEDURE  You may need to have an ultrasound or other imaging tests to see how large or deep your cystic structure is. Blood tests may also be used to determine if you have an infection or how severe the infection is. You may need to have a tetanus shot. PROCEDURE  The affected area is cleaned with a cleaning fluid. The cyst area will then be numbed with a medicine (local anesthetic). A small incision will be made in the cystic structure. A syringe or catheter may be used to drain the contents of the cystic structure, or the contents may be squeezed out. The area will then be flushed with a cleansing solution. After cleansing the area, it is often gently packed with a gauze or another wound dressing. Once it is packed, it will be covered with gauze and tape or some other type of wound dressing. AFTER THE PROCEDURE   Often, you will be allowed to go home right after the procedure.  You may be given antibiotic medicine to prevent or heal an infection.  If the area was packed with gauze or some other wound dressing, you will likely need to come back in 1 to 2 days to get it removed.  The area should heal in about 14 days. Document Released: 05/28/2001 Document Revised: 06/02/2012 Document Reviewed: 01/27/2012 ExitCare  Patient Information 2015 [place]. This information is not intended to replace advice given to you by your health care provider. Make sure you discuss any questions you have with your health care provider.  Take the prescription antibiotic as directed until completely gone. Take the pain medicine as needed. Return for wound check in 3 days.

## 2015-08-25 NOTE — ED Notes (Signed)
Pt reports vaginal cyst x 3 days.  Pt reports cysts in past and had to have in lanced in ED.  Pt report cyst about 3cm diameter.  Pt NAD at this time.

## 2015-08-25 NOTE — ED Notes (Signed)
Pt to ED c/o vaginal cyst.

## 2015-09-30 ENCOUNTER — Encounter: Payer: Self-pay | Admitting: Emergency Medicine

## 2015-09-30 ENCOUNTER — Emergency Department
Admission: EM | Admit: 2015-09-30 | Discharge: 2015-09-30 | Disposition: A | Discharge status: Home / Self Care | Payer: Managed Care, Other (non HMO) | Attending: Student | Admitting: Student

## 2015-09-30 DIAGNOSIS — N764 Abscess of vulva: Secondary | ICD-10-CM | POA: Diagnosis present

## 2015-09-30 DIAGNOSIS — Z720 Tobacco use: Secondary | ICD-10-CM | POA: Diagnosis not present

## 2015-09-30 MED ORDER — LIDOCAINE-EPINEPHRINE (PF) 1 %-1:200000 IJ SOLN
30.0000 mL | Freq: Once | INTRAMUSCULAR | Status: AC
  Administered 2015-09-30: 30 mL

## 2015-09-30 MED ORDER — LIDOCAINE-EPINEPHRINE (PF) 2 %-1:200000 IJ SOLN
30.0000 mL | Freq: Once | INTRAMUSCULAR | Status: DC
  Filled 2015-09-30: qty 30

## 2015-09-30 MED ORDER — LIDOCAINE-EPINEPHRINE 1 %-1:200000 IJ SOLN
INTRAMUSCULAR | Status: AC
Start: 2015-09-30 — End: 2015-09-30
  Filled 2015-09-30: qty 30

## 2015-09-30 MED ORDER — TRAMADOL HCL 50 MG PO TABS: 50.0000 mg | ORAL_TABLET | Freq: Two times a day (BID) | ORAL | Status: DC

## 2015-09-30 MED ORDER — CLINDAMYCIN HCL 300 MG PO CAPS: 300.0000 mg | ORAL_CAPSULE | Freq: Four times a day (QID) | ORAL | Status: DC

## 2015-09-30 NOTE — ED Provider Notes (Signed)
[facility] Emergency Department Provider Note ____________________________________________  Time seen: 0820  I have reviewed the triage vital signs and the nursing notes.  HISTORY  Chief Complaint  Abscess  HPI Carrie [name] is a 39 y.o. female who reports to the ED for evaluation management of a vulvar abscess. She describes sudden onset of the abscess of the last 2 days. She denies any outright fevers, but notes chills, sweats last night. She dosed Aleve 2 AM this morning. She has a history of recurrence of vulvar abscesses, last seen by me one month ago for a local I&D procedure. She has not followed up with her primary care provider due to her busy travel schedule.She rates her pain at a 10/10 in triage.  History reviewed. No pertinent past medical history.  There are no active problems to display for this patient.   Past Surgical History  Procedure Laterality Date  . Cesarean section      Current Outpatient Rx  Name  Route  Sig  Dispense  Refill  . clindamycin (CLEOCIN) 300 MG capsule   Oral   Take 1 capsule (300 mg total) by mouth 4 (four) times daily.   40 capsule   0   . cyclobenzaprine (FLEXERIL) 5 MG tablet   Oral   Take 1 tablet (5 mg total) by mouth every 8 (eight) hours as needed for muscle spasms (or pain. Do not drive or operate machinery while taking as can cause drowsiness.).   12 tablet   0   . ibuprofen (MOTRIN IB) 200 MG tablet   Oral   Take 2 tablets (400 mg total) by mouth every 8 (eight) hours as needed for mild pain or moderate pain.   15 tablet   0   . oxyCODONE-acetaminophen (PERCOCET/ROXICET) 5-325 MG per tablet   Oral   Take 1 tablet by mouth every 4 (four) hours as needed for severe pain.   20 tablet   0   . traMADol (ULTRAM) 50 MG tablet   Oral   Take 1 tablet (50 mg total) by mouth 2 (two) times daily.   10 tablet   0    Allergies Review of patient's allergies indicates no known allergies.  History  reviewed. No pertinent family history.  Social History Social History  Substance Use Topics  . Smoking status: Current Every Day Smoker  . Smokeless tobacco: None  . Alcohol Use: No   Review of Systems  Constitutional: Negative for fever. Eyes: Negative for visual changes. ENT: Negative for sore throat. Cardiovascular: Negative for chest pain. Respiratory: Negative for shortness of breath. Gastrointestinal: Negative for abdominal pain, vomiting and diarrhea. Genitourinary: Negative for dysuria. Musculoskeletal: Negative for back pain. Skin: Negative for rash. Vulvar abscess as above Neurological: Negative for headaches, focal weakness or numbness. ____________________________________________  PHYSICAL EXAM:  VITAL SIGNS: ED Triage Vitals  Enc Vitals Group     BP 09/30/15 0749 128/77 mmHg     Pulse Rate 09/30/15 0749 80     Resp 09/30/15 0749 20     Temp 09/30/15 0749 97.8 F (36.6 C)     Temp Source 09/30/15 0749 Oral     SpO2 09/30/15 0749 100 %     Weight 09/30/15 0749 95 lb (43.092 kg)     Height 09/30/15 0749 5' (1.524 m)     Head Cir --      Peak Flow --      Pain Score 09/30/15 0750 10     Pain  Loc --      Pain Edu? --      Excl. in [place]? --    Constitutional: Alert and oriented. Well appearing and in no distress. Head: Normocephalic and atraumatic.      Eyes: Conjunctivae are normal. PERRL. Normal extraocular movements      Ears: Canals clear. TMs intact bilaterally.   Nose: No congestion/rhinorrhea.   Mouth/Throat: Mucous membranes are moist.   Neck: Supple. No thyromegaly. Hematological/Lymphatic/Immunological: No cervical lymphadenopathy. Cardiovascular: Normal rate, regular rhythm.  Respiratory: Normal respiratory effort. No wheezes/rales/rhonchi. Gastrointestinal: Soft and nontender. No distention. GU:  Normal external genitalia. Left labial abscess with pointing noted.  Musculoskeletal: Nontender with normal range of motion in all  extremities.  Neurologic:  Normal gait without ataxia. Normal speech and language. No gross focal neurologic deficits are appreciated. Skin:  Skin is warm, dry and intact. No rash noted. Psychiatric: Mood and affect are normal. Patient exhibits appropriate insight and judgment. ____________________________________________  PROCEDURES  INCISION AND DRAINAGE Performed by: Melvenia Needles Consent: Verbal consent obtained. Risks and benefits: risks, benefits and alternatives were discussed Type: abscess  Body area: left labia  Anesthesia: local infiltration  Incision was made with a scalpel.  Local anesthetic: lidocaine 1% w/ epinephrine  Anesthetic total: 4 ml  Complexity: complex Blunt dissection to break up loculations  Drainage: purulent  Drainage amount: moderate  Packing material: 1/4 in iodoform gauze  Patient tolerance: Patient tolerated the procedure well with no immediate complications. ____________________________________________  INITIAL IMPRESSION / ASSESSMENT AND PLAN / ED COURSE  Left vulvar abscess status post I&D. Patient is advised on abscess care management. She is discharged with prescription for clindamycin and Ultram to dose as prescribed. She will follow-up with Dr. Enzo Bi in 2-3 days for wound check. ____________________________________________  FINAL CLINICAL IMPRESSION(S) / ED DIAGNOSES  Final diagnoses:  Vulvar abscess       Melvenia Needles, PA-C 09/30/15 0940  Joanne Gavel, MD 09/30/15 1244

## 2015-09-30 NOTE — ED Notes (Signed)
Pt to ed with c/o abscess in vaginal area.  Pt states hx of same.

## 2015-09-30 NOTE — Discharge Instructions (Signed)
Incision and Drainage °Incision and drainage is a procedure in which a sac-like structure (cystic structure) is opened and drained. The area to be drained usually contains material such as pus, fluid, or blood.  °LET YOUR CAREGIVER KNOW ABOUT:  °· Allergies to medicine. °· Medicines taken, including vitamins, herbs, eyedrops, over-the-counter medicines, and creams. °· Use of steroids (by mouth or creams). °· Previous problems with anesthetics or numbing medicines. °· History of bleeding problems or blood clots. °· Previous surgery. °· Other health problems, including diabetes and kidney problems. °· Possibility of pregnancy, if this applies. °RISKS AND COMPLICATIONS °· Pain. °· Bleeding. °· Scarring. °· Infection. °BEFORE THE PROCEDURE  °You may need to have an ultrasound or other imaging tests to see how large or deep your cystic structure is. Blood tests may also be used to determine if you have an infection or how severe the infection is. You may need to have a tetanus shot. °PROCEDURE  °The affected area is cleaned with a cleaning fluid. The cyst area will then be numbed with a medicine (local anesthetic). A small incision will be made in the cystic structure. A syringe or catheter may be used to drain the contents of the cystic structure, or the contents may be squeezed out. The area will then be flushed with a cleansing solution. After cleansing the area, it is often gently packed with a gauze or another wound dressing. Once it is packed, it will be covered with gauze and tape or some other type of wound dressing.  °AFTER THE PROCEDURE  °· Often, you will be allowed to go home right after the procedure. °· You may be given antibiotic medicine to prevent or heal an infection. °· If the area was packed with gauze or some other wound dressing, you will likely need to come back in 1 to 2 days to get it removed. °· The area should heal in about 14 days. °  °This information is not intended to replace advice given  to you by your health care provider. Make sure you discuss any questions you have with your health care provider. °  °Document Released: 05/28/2001 Document Revised: 06/02/2012 Document Reviewed: 01/27/2012 °Elsevier Interactive Patient Education ©2016 Else[employer] Inc[employer] ° °Abscess °An abscess is an infected area that contains a collection of pus and debris. It can occur in almost any part of the body. An abscess is also known as a furuncle or boil. °CAUSES  °An abscess occurs when tissue gets infected. This can occur from blockage of oil or sweat glands, infection of hair follicles, or a minor injury to the skin. As the body tries to fight the infection, pus collects in the area and creates pressure under the skin. This pressure causes pain. People with weakened immune systems have difficulty fighting infections and get certain abscesses more often.  °SYMPTOMS °Usually an abscess develops on the skin and becomes a painful mass that is red, warm, and tender. If the abscess forms under the skin, you may feel a moveable soft area under the skin. Some abscesses break open (rupture) on their own, but most will continue to get worse without care. The infection can spread deeper into the body and eventually into the bloodstream, causing you to feel ill.  °DIAGNOSIS  °Your caregiver will take your medical history and perform a physical exam. A sample of fluid may also be taken from the abscess to determine what is causing your infection. °TREATMENT  °Your caregiver may prescribe antibiotic medicines to fight the infection.   However, taking antibiotics alone usually does not cure an abscess. Your caregiver may need to make a small cut (incision) in the abscess to drain the pus. In some cases, gauze is packed into the abscess to reduce pain and to continue draining the area. HOME CARE INSTRUCTIONS   Only take over-the-counter or prescription medicines for pain, discomfort, or fever as directed by your caregiver.  If you were  prescribed antibiotics, take them as directed. Finish them even if you start to feel better.  If gauze is used, follow your caregiver's directions for changing the gauze.  To avoid spreading the infection:  Keep your draining abscess covered with a bandage.  Wash your hands well.  Do not share personal care items, towels, or whirlpools with others.  Avoid skin contact with others.  Keep your skin and clothes clean around the abscess.  Keep all follow-up appointments as directed by your caregiver. SEEK MEDICAL CARE IF:   You have increased pain, swelling, redness, fluid drainage, or bleeding.  You have muscle aches, chills, or a general ill feeling.  You have a fever. MAKE SURE YOU:   Understand these instructions.  Will watch your condition.  Will get help right away if you are not doing well or get worse.   This information is not intended to replace advice given to you by your health care provider. Make sure you discuss any questions you have with your health care provider.   Document Released: 09/11/2005 Document Revised: 06/02/2012 Document Reviewed: 02/14/2012 Elsevier Interactive Patient Education [employer].  Return to the ED as needed for wound check. Take the antibiotic as directed until completely gone. Apply warm compresses to promote healing. Follow-up with Dr. Enzo Bi for further management.

## 2015-09-30 NOTE — ED Notes (Signed)
NAD noted at time of D/C. Pt denies questions or concerns. Pt ambulatory to the lobby at this time.  

## 2015-10-24 ENCOUNTER — Ambulatory Visit (INDEPENDENT_AMBULATORY_CARE_PROVIDER_SITE_OTHER): Payer: Managed Care, Other (non HMO) | Admitting: Obstetrics and Gynecology

## 2015-10-24 ENCOUNTER — Encounter: Payer: Self-pay | Admitting: Obstetrics and Gynecology

## 2015-10-24 VITALS — BP 122/77 | HR 70 | Ht 60.0 in | Wt 105.5 lb

## 2015-10-24 DIAGNOSIS — N9089 Other specified noninflammatory disorders of vulva and perineum: Secondary | ICD-10-CM | POA: Diagnosis not present

## 2015-10-24 DIAGNOSIS — N907 Vulvar cyst: Secondary | ICD-10-CM | POA: Diagnosis not present

## 2015-10-24 DIAGNOSIS — Z720 Tobacco use: Secondary | ICD-10-CM | POA: Insufficient documentation

## 2015-10-24 HISTORY — DX: Vulvar cyst: N90.7

## 2015-10-24 NOTE — Progress Notes (Signed)
Patient ID: Carrie [name], female   DOB: 04-04-1976, 39 y.o.   MRN: [id number]  GYN ENCOUNTER NOTE  Subjective:       Carrie [name] is a 39 y.o. G33P2002 female is here for gynecologic evaluation of the following issues:  1. Bartholin cyst x 4 over last several months Seen in er for vulvar abscess multiple times  needs AE    Gynecologic History  Obstetric History OB History  Gravida Para Term Preterm AB SAB TAB Ectopic Multiple Living  2 2 2       2     # Outcome Date GA Lbr Len/2nd Weight Sex Delivery Anes PTL Lv  2 Term 2013   8 lb (3.629 kg) M CS-LTranv   Y  1 Term 2000   7 lb 6.4 oz (3.357 kg) F VBAC   Y      Past Medical History  Diagnosis Date  . Muscle spasm   . Bartholin cyst     Past Surgical History  Procedure Laterality Date  . Cesarean section    . Tubal ligation      No current outpatient prescriptions on file prior to visit.   No current facility-administered medications on file prior to visit.    No Known Allergies  Social History   Social History  . Marital Status: Married    Spouse Name: N/A  . Number of Children: N/A  . Years of Education: N/A   Occupational History  . Not on file.   Social History Main Topics  . Smoking status: Current Every Day Smoker -- 0.25 packs/day    Types: Cigarettes  . Smokeless tobacco: Not on file  . Alcohol Use: No  . Drug Use: No  . Sexual Activity: Yes    Birth Control/ Protection: Surgical   Other Topics Concern  . Not on file   Social History Narrative    Family History  Problem Relation Age of Onset  . Diabetes Maternal Grandmother   . Breast cancer Neg Hx   . Ovarian cancer Neg Hx   . Heart disease Neg Hx     The following portions of the patient's history were reviewed and updated as appropriate: allergies, current medications, past family history, past medical history, past social history, past surgical history and problem list.  Review of Systems Review of Systems - General ROS:  negative for - chills, fatigue, fever, hot flashes, malaise or night sweats Hematological and Lymphatic ROS: negative for - bleeding problems or swollen lymph nodes Gastrointestinal ROS: negative for - abdominal pain, blood in stools, change in bowel habits and nausea/vomiting Musculoskeletal ROS: negative for - joint pain, muscle pain or muscular weakness Genito-Urinary ROS: negative for - change in menstrual cycle, dysmenorrhea, dyspareunia, dysuria, genital discharge, genital ulcers, hematuria, incontinence, irregular/heavy menses, nocturia or pelvic pain.  POSITIVE-left vulvar cyst  Objective:   BP 122/77 mmHg  Pulse 70  Ht 5' (1.524 m)  Wt 105 lb 8 oz (47.854 kg)  BMI 20.60 kg/m2  LMP 10/18/2015 CONSTITUTIONAL: Well-developed, well-nourished female in no acute distress.  HENT:  Normocephalic, atraumatic.   warm and dry. No rash noted. Not diaphoretic. No erythema. No pallor. [place]: Alert and oriented to person, place, and time. PSYCHIATRIC: Normal mood and affect. Normal behavior. Normal judgment and thought content. CARDIOVASCULAR:Not Examined RESPIRATORY: Not Examined BREASTS: Not Examined ABDOMEN: Soft, non distended; Non tender.  No Organomegaly. PELVIC:  External Genitalia: Prominent clitoris (clitorimegaly); normal labia minora; 5 mm subcutaneous nodule in left labia majora  in region of Bartholin's gland  BUS: Normal  Vagina: Normal  Cervix: Not examined  Uterus: Not examined  Adnexa: Normal  RV: Normal External exam  Bladder: Nontender MUSCULOSKELETAL: Normal range of motion. No tenderness.  No cyanosis, clubbing, or edema.     Assessment:   1. Vulvar cyst, Left labia majora; no evidence of Bartholin gland cyst or abscess 2.  Clitorimegaly     Plan:   1.  Return as needed when left vulvar cyst becomes symptomatic. 2.  Return within 2 months for annual physical exam  Brayton Mars, MD  Note: This dictation was prepared with Dragon dictation  along with smaller phrase technology. Any transcriptional errors that result from this process are unintentional.

## 2015-10-24 NOTE — Patient Instructions (Signed)
1.  Small 5 mm subcutaneous nodule in left labia majora, noninflamed.  No need for intervention at this time. 2.  Return if the nodule in the vulva becomes symptomatic. 3.  Return in the next 2 months for routine physical.

## 2015-11-14 ENCOUNTER — Ambulatory Visit (INDEPENDENT_AMBULATORY_CARE_PROVIDER_SITE_OTHER): Payer: Managed Care, Other (non HMO) | Admitting: Obstetrics and Gynecology

## 2015-11-14 ENCOUNTER — Encounter: Payer: Self-pay | Admitting: Obstetrics and Gynecology

## 2015-11-14 VITALS — BP 118/79 | HR 89 | Ht 60.0 in | Wt 103.4 lb

## 2015-11-14 DIAGNOSIS — F419 Anxiety disorder, unspecified: Secondary | ICD-10-CM

## 2015-11-14 DIAGNOSIS — Z1239 Encounter for other screening for malignant neoplasm of breast: Secondary | ICD-10-CM | POA: Diagnosis not present

## 2015-11-14 DIAGNOSIS — G4700 Insomnia, unspecified: Secondary | ICD-10-CM | POA: Insufficient documentation

## 2015-11-14 DIAGNOSIS — F329 Major depressive disorder, single episode, unspecified: Secondary | ICD-10-CM | POA: Insufficient documentation

## 2015-11-14 DIAGNOSIS — Z01419 Encounter for gynecological examination (general) (routine) without abnormal findings: Secondary | ICD-10-CM | POA: Diagnosis not present

## 2015-11-14 DIAGNOSIS — F418 Other specified anxiety disorders: Secondary | ICD-10-CM | POA: Diagnosis not present

## 2015-11-14 MED ORDER — SERTRALINE HCL 50 MG PO TABS: 50.0000 mg | ORAL_TABLET | Freq: Every day | ORAL | Status: AC

## 2015-11-14 NOTE — Patient Instructions (Signed)
1.  Pap smear is done. 2.  Mammogram is recommended. 3.  Zoloft 50 mg a day is to be started;Begin with one half tablet a day for 7 days, then go to 1 tablet daily. 4.  Return in 6 weeks for follow-up

## 2015-11-14 NOTE — Progress Notes (Signed)
Patient ID: Carrie [name], female   DOB: [birth year], 39 y.o.   MRN: [id number]  ANNUAL PREVENTATIVE CARE GYN  ENCOUNTER NOTE  Subjective:       Carrie [name] is a 39 y.o. G40P2002 female here for a routine annual gynecologic exam.  Current complaints: 1.  Death of mother in 07/06/15- trouble sleeping- wants to discuss   Mom died in July 06, 2015 secondary to brain cancer complications.  She does have moments of anxiety and has difficulty sleeping at night as her mind tends to race.  She is seeing a Social worker, which has helped.   Gynecologic History Patient's last menstrual period was 10/18/2015. Contraception: tubal ligation Last Pap: 2013. Results were: normal Last mammogram: n/a. Results were: n/a  Obstetric History OB History  Gravida Para Term Preterm AB SAB TAB Ectopic Multiple Living  2 2 2       2     # Outcome Date GA Lbr Len/2nd Weight Sex Delivery Anes PTL Lv  2 Term 2013   8 lb (3.629 kg) M CS-LTranv   Y  1 Term 2000   7 lb 6.4 oz (3.357 kg) F VBAC   Y      Past Medical History  Diagnosis Date  . Muscle spasm   . Bartholin cyst     Past Surgical History  Procedure Laterality Date  . Cesarean section    . Tubal ligation      No current outpatient prescriptions on file prior to visit.   No current facility-administered medications on file prior to visit.    No Known Allergies  Social History   Social History  . Marital Status: Married    Spouse Name: N/A  . Number of Children: N/A  . Years of Education: N/A   Occupational History  . Not on file.   Social History Main Topics  . Smoking status: Current Every Day Smoker -- 0.25 packs/day    Types: Cigarettes  . Smokeless tobacco: Not on file  . Alcohol Use: No  . Drug Use: No  . Sexual Activity: Yes    Birth Control/ Protection: Surgical   Other Topics Concern  . Not on file   Social History Narrative    Family History  Problem Relation Age of Onset  . Diabetes Maternal Grandmother   .  Breast cancer Neg Hx   . Ovarian cancer Neg Hx   . Heart disease Neg Hx     The following portions of the patient's history were reviewed and updated as appropriate: allergies, current medications, past family history, past medical history, past social history, past surgical history and problem list.  Review of Systems ROS Review of Systems - General ROS: negative for - chills, fatigue, fever, hot flashes, night sweats, weight gain or weight loss Psychological ROS: negative for -  decreased libido, depression, mood swings, physical abuse or sexual abuse.  POSITIVE-anxiety,Difficulty sleeping Ophthalmic ROS: negative for - blurry vision, eye pain or loss of vision ENT ROS: negative for - headaches, hearing change, visual changes or vocal changes Allergy and Immunology ROS: negative for - hives, itchy/watery eyes or seasonal allergies Hematological and Lymphatic ROS: negative for - bleeding problems, bruising, swollen lymph nodes or weight loss Endocrine ROS: negative for - galactorrhea, hair pattern changes, hot flashes, malaise/lethargy, mood swings, palpitations, polydipsia/polyuria, skin changes, temperature intolerance or unexpected weight changes Breast ROS: negative for - new or changing breast lumps or nipple discharge Respiratory ROS: negative for - cough or shortness of breath Cardiovascular  ROS: negative for - chest pain, irregular heartbeat, palpitations or shortness of breath Gastrointestinal ROS: no abdominal pain, change in bowel habits, or black or bloody stools Genito-Urinary ROS: no dysuria, trouble voiding, or hematuria Musculoskeletal ROS: negative for - joint pain or joint stiffness Neurological ROS: negative for - bowel and bladder control changes Dermatological ROS: negative for rash and skin lesion changes   Objective:   BP 118/79 mmHg  Pulse 89  Ht 5' (1.524 m)  Wt 103 lb 6.4 oz (46.902 kg)  BMI 20.19 kg/m2  LMP 10/18/2015 CONSTITUTIONAL: Well-developed,  well-nourished female in no acute distress.  PSYCHIATRIC: Normal mood and affect. Normal behavior. Normal judgment and thought content. [place]: Alert and oriented to person, place, and time. Normal muscle tone coordination. No cranial nerve deficit noted. HENT:  Normocephalic, atraumatic, External right and left ear normal. Oropharynx is clear and moist EYES: Conjunctivae and EOM are normal. Pupils are equal, round, and reactive to light. No scleral icterus.  NECK: Normal range of motion, supple, no masses.  Normal thyroid.  SKIN: Skin is warm and dry. No rash noted. Not diaphoretic. No erythema. No pallor. CARDIOVASCULAR: Normal heart rate noted, regular rhythm, no murmur. RESPIRATORY: Clear to auscultation bilaterally. Effort and breath sounds normal, no problems with respiration noted. BREASTS: Symmetric in size. No masses, skin changes, nipple drainage, or lymphadenopathy. ABDOMEN: Soft, normal bowel sounds, no distention noted.  No tenderness, rebound or guarding. Well-healed Pfannenstiel incision BLADDER: Normal PELVIC:  External Genitalia: Normal; Prominent clitoris  BUS: Normal  Vagina: Normal  Cervix: Normal; Situated high anteriorly within the vagina  Uterus: Normal; Anteverted, mobile, nontender  Adnexa: Normal; Nonpalpable, nontender  RV: External Exam NormaI, No Rectal Masses and Normal Sphincter tone  MUSCULOSKELETAL: Normal range of motion. No tenderness.  No cyanosis, clubbing, or edema.  2+ distal pulses. LYMPHATIC: No Axillary, Supraclavicular, or Inguinal Adenopathy.    Assessment:   Annual gynecologic examination 39 y.o. Contraception: tubal ligation Normal BMI Anxiety/depression, Secondary to Loss of mom in July 2016  Plan:  Pap: Pap Co Test Mammogram: Ordered Stool Guaiac Testing:  Not Indicated Labs: tsh fsb a1c vit d lipid Routine preventative health maintenance measures emphasized: Exercise/Diet/Weight control, Tobacco Warnings, Alcohol/Substance use  risks and Stress Management Begin Zoloft 50 mg a day. Return in 6 weeks for follow-up Return to [place], [place]  Brayton Mars, MD  Note: This dictation was prepared with Dragon dictation along with smaller phrase technology. Any transcriptional errors that result from this process are unintentional.

## 2015-11-22 LAB — PAP IG AND HPV HIGH-RISK
HPV, high-risk: NEGATIVE
PAP SMEAR COMMENT: 0

## 2015-11-23 ENCOUNTER — Encounter: Payer: Self-pay | Admitting: Obstetrics and Gynecology

## 2015-12-26 ENCOUNTER — Ambulatory Visit: Payer: Managed Care, Other (non HMO) | Admitting: Obstetrics and Gynecology

## 2016-11-19 ENCOUNTER — Encounter: Payer: Managed Care, Other (non HMO) | Admitting: Obstetrics and Gynecology

## 2017-01-17 NOTE — Progress Notes (Deleted)
Patient ID: Carrie [name], female   DOB: August 25, 1976, 41 y.o.   MRN: [id number]  ANNUAL PREVENTATIVE CARE GYN  ENCOUNTER NOTE  Subjective:       Carrie [name] is a 41 y.o. G74P2002 female here for a routine annual gynecologic exam.  Current complaints: 1.      Gynecologic History No LMP recorded. Contraception: tubal ligation Last Pap: 10/2015 n/n. Results were: normal Last mammogram: n/a. Results were: n/a  Obstetric History OB History  Gravida Para Term Preterm AB Living  2 2 2     2   SAB TAB Ectopic Multiple Live Births          2    # Outcome Date GA Lbr Len/2nd Weight Sex Delivery Anes PTL Lv  2 Term 2013   8 lb (3.629 kg) M CS-LTranv   LIV  1 Term 2000   7 lb 6.4 oz (3.357 kg) F VBAC   LIV      Past Medical History:  Diagnosis Date  . Bartholin cyst   . Muscle spasm     Past Surgical History:  Procedure Laterality Date  . CESAREAN SECTION    . TUBAL LIGATION      Current Outpatient Prescriptions on File Prior to Visit  Medication Sig Dispense Refill  . sertraline (ZOLOFT) 50 MG tablet Take 1 tablet (50 mg total) by mouth at bedtime. Take 1/2 tab for 7 days then daily. 30 tablet 6   No current facility-administered medications on file prior to visit.     No Known Allergies  Social History   Social History  . Marital status: Married    Spouse name: N/A  . Number of children: N/A  . Years of education: N/A   Occupational History  . Not on file.   Social History Main Topics  . Smoking status: Current Every Day Smoker    Packs/day: 0.25    Types: Cigarettes  . Smokeless tobacco: Not on file  . Alcohol use No  . Drug use: No  . Sexual activity: Yes    Birth control/ protection: Surgical   Other Topics Concern  . Not on file   Social History Narrative  . No narrative on file    Family History  Problem Relation Age of Onset  . Diabetes Maternal Grandmother   . Breast cancer Neg Hx   . Ovarian cancer Neg Hx   . Heart disease Neg Hx      The following portions of the patient's history were reviewed and updated as appropriate: allergies, current medications, past family history, past medical history, past social history, past surgical history and problem list.  Review of Systems ROS Review of Systems - General ROS: negative for - chills, fatigue, fever, hot flashes, night sweats, weight gain or weight loss Psychological ROS: negative for -  decreased libido, depression, mood swings, physical abuse or sexual abuse.  POSITIVE-anxiety,Difficulty sleeping Ophthalmic ROS: negative for - blurry vision, eye pain or loss of vision ENT ROS: negative for - headaches, hearing change, visual changes or vocal changes Allergy and Immunology ROS: negative for - hives, itchy/watery eyes or seasonal allergies Hematological and Lymphatic ROS: negative for - bleeding problems, bruising, swollen lymph nodes or weight loss Endocrine ROS: negative for - galactorrhea, hair pattern changes, hot flashes, malaise/lethargy, mood swings, palpitations, polydipsia/polyuria, skin changes, temperature intolerance or unexpected weight changes Breast ROS: negative for - new or changing breast lumps or nipple discharge Respiratory ROS: negative for - cough or shortness of  breath Cardiovascular ROS: negative for - chest pain, irregular heartbeat, palpitations or shortness of breath Gastrointestinal ROS: no abdominal pain, change in bowel habits, or black or bloody stools Genito-Urinary ROS: no dysuria, trouble voiding, or hematuria Musculoskeletal ROS: negative for - joint pain or joint stiffness Neurological ROS: negative for - bowel and bladder control changes Dermatological ROS: negative for rash and skin lesion changes   Objective:   There were no vitals taken for this visit. CONSTITUTIONAL: Well-developed, well-nourished female in no acute distress.  PSYCHIATRIC: Normal mood and affect. Normal behavior. Normal judgment and thought content. [place]:  Alert and oriented to person, place, and time. Normal muscle tone coordination. No cranial nerve deficit noted. HENT:  Normocephalic, atraumatic, External right and left ear normal. Oropharynx is clear and moist EYES: Conjunctivae and EOM are normal. Pupils are equal, round, and reactive to light. No scleral icterus.  NECK: Normal range of motion, supple, no masses.  Normal thyroid.  SKIN: Skin is warm and dry. No rash noted. Not diaphoretic. No erythema. No pallor. CARDIOVASCULAR: Normal heart rate noted, regular rhythm, no murmur. RESPIRATORY: Clear to auscultation bilaterally. Effort and breath sounds normal, no problems with respiration noted. BREASTS: Symmetric in size. No masses, skin changes, nipple drainage, or lymphadenopathy. ABDOMEN: Soft, normal bowel sounds, no distention noted.  No tenderness, rebound or guarding. Well-healed Pfannenstiel incision BLADDER: Normal PELVIC:  External Genitalia: Normal; Prominent clitoris  BUS: Normal  Vagina: Normal  Cervix: Normal; Situated high anteriorly within the vagina  Uterus: Normal; Anteverted, mobile, nontender  Adnexa: Normal; Nonpalpable, nontender  RV: External Exam NormaI, No Rectal Masses and Normal Sphincter tone  MUSCULOSKELETAL: Normal range of motion. No tenderness.  No cyanosis, clubbing, or edema.  2+ distal pulses. LYMPHATIC: No Axillary, Supraclavicular, or Inguinal Adenopathy.    Assessment:   Annual gynecologic examination 41 y.o. Contraception: tubal ligation Normal BMI Anxiety/depression  Plan:  Pap: due 2019 Mammogram: Ordered Stool Guaiac Testing:  Not Indicated Labs: tsh fsb a1c vit d lipid Routine preventative health maintenance measures emphasized: Exercise/Diet/Weight control, Tobacco Warnings, Alcohol/Substance use risks and Stress Management Begin Zoloft 50 mg a day. Return in 6 weeks for follow-up Return to [place]    Note: This dictation was prepared with Dragon  dictation along with smaller phrase technology. Any transcriptional errors that result from this process are unintentional.

## 2017-01-21 ENCOUNTER — Encounter: Payer: Managed Care, Other (non HMO) | Admitting: Obstetrics and Gynecology

## 2017-06-03 ENCOUNTER — Emergency Department: Payer: BLUE CROSS/BLUE SHIELD

## 2017-06-03 ENCOUNTER — Emergency Department
Admission: EM | Admit: 2017-06-03 | Discharge: 2017-06-03 | Disposition: A | Discharge status: Home / Self Care | Payer: BLUE CROSS/BLUE SHIELD | Attending: Emergency Medicine | Admitting: Emergency Medicine

## 2017-06-03 DIAGNOSIS — M79605 Pain in left leg: Secondary | ICD-10-CM

## 2017-06-03 DIAGNOSIS — R202 Paresthesia of skin: Secondary | ICD-10-CM | POA: Insufficient documentation

## 2017-06-03 DIAGNOSIS — F1210 Cannabis abuse, uncomplicated: Secondary | ICD-10-CM | POA: Diagnosis not present

## 2017-06-03 DIAGNOSIS — M79662 Pain in left lower leg: Secondary | ICD-10-CM | POA: Diagnosis not present

## 2017-06-03 DIAGNOSIS — R200 Anesthesia of skin: Secondary | ICD-10-CM

## 2017-06-03 DIAGNOSIS — F17210 Nicotine dependence, cigarettes, uncomplicated: Secondary | ICD-10-CM | POA: Insufficient documentation

## 2017-06-03 DIAGNOSIS — H538 Other visual disturbances: Secondary | ICD-10-CM | POA: Insufficient documentation

## 2017-06-03 DIAGNOSIS — F1290 Cannabis use, unspecified, uncomplicated: Secondary | ICD-10-CM

## 2017-06-03 LAB — URINALYSIS, COMPLETE (UACMP) WITH MICROSCOPIC
BILIRUBIN URINE: NEGATIVE
Bacteria, UA: NONE SEEN
GLUCOSE, UA: NEGATIVE mg/dL
Hgb urine dipstick: NEGATIVE
Ketones, ur: 5 mg/dL — AB
LEUKOCYTES UA: NEGATIVE
Nitrite: NEGATIVE
PH: 5 (ref 5.0–8.0)
Protein, ur: NEGATIVE mg/dL
SPECIFIC GRAVITY, URINE: 1.027 (ref 1.005–1.030)

## 2017-06-03 LAB — CBC
HCT: 37.9 % (ref 35.0–47.0)
Hemoglobin: 13 g/dL (ref 12.0–16.0)
MCH: 30.6 pg (ref 26.0–34.0)
MCHC: 34.2 g/dL (ref 32.0–36.0)
MCV: 89.3 fL (ref 80.0–100.0)
PLATELETS: 366 10*3/uL (ref 150–440)
RBC: 4.24 MIL/uL (ref 3.80–5.20)
RDW: 17.4 % — AB (ref 11.5–14.5)
WBC: 8.1 10*3/uL (ref 3.6–11.0)

## 2017-06-03 LAB — COMPREHENSIVE METABOLIC PANEL
ALT: 10 U/L — ABNORMAL LOW (ref 14–54)
AST: 29 U/L (ref 15–41)
Albumin: 4.1 g/dL (ref 3.5–5.0)
Alkaline Phosphatase: 61 U/L (ref 38–126)
Anion gap: 9 (ref 5–15)
BILIRUBIN TOTAL: 0.6 mg/dL (ref 0.3–1.2)
BUN: 12 mg/dL (ref 6–20)
CHLORIDE: 106 mmol/L (ref 101–111)
CO2: 24 mmol/L (ref 22–32)
Calcium: 9.6 mg/dL (ref 8.9–10.3)
Creatinine, Ser: 0.82 mg/dL (ref 0.44–1.00)
Glucose, Bld: 171 mg/dL — ABNORMAL HIGH (ref 65–99)
POTASSIUM: 3.4 mmol/L — AB (ref 3.5–5.1)
Sodium: 139 mmol/L (ref 135–145)
TOTAL PROTEIN: 7.5 g/dL (ref 6.5–8.1)

## 2017-06-03 LAB — DIFFERENTIAL
BASOS PCT: 1 %
Basophils Absolute: 0.1 10*3/uL (ref 0–0.1)
EOS ABS: 0.2 10*3/uL (ref 0–0.7)
Eosinophils Relative: 2 %
Lymphocytes Relative: 38 %
Lymphs Abs: 3.1 10*3/uL (ref 1.0–3.6)
MONO ABS: 0.6 10*3/uL (ref 0.2–0.9)
Monocytes Relative: 8 %
NEUTROS PCT: 51 %
Neutro Abs: 4.1 10*3/uL (ref 1.4–6.5)

## 2017-06-03 LAB — URINE DRUG SCREEN, QUALITATIVE (ARMC ONLY)
AMPHETAMINES, UR SCREEN: NOT DETECTED
BENZODIAZEPINE, UR SCRN: NOT DETECTED
Barbiturates, Ur Screen: NOT DETECTED
COCAINE METABOLITE, UR ~~LOC~~: NOT DETECTED
Cannabinoid 50 Ng, Ur ~~LOC~~: POSITIVE — AB
MDMA (Ecstasy)Ur Screen: NOT DETECTED
METHADONE SCREEN, URINE: NOT DETECTED
OPIATE, UR SCREEN: NOT DETECTED
PHENCYCLIDINE (PCP) UR S: NOT DETECTED
Tricyclic, Ur Screen: NOT DETECTED

## 2017-06-03 LAB — TROPONIN I

## 2017-06-03 LAB — ETHANOL: Alcohol, Ethyl (B): <5 mg/dL (ref <5)

## 2017-06-03 MED ORDER — POTASSIUM CHLORIDE CRYS ER 20 MEQ PO TBCR
40.0000 meq | EXTENDED_RELEASE_TABLET | Freq: Once | ORAL | Status: AC
  Administered 2017-06-03: 40 meq via ORAL
  Filled 2017-06-03 (×2): qty 2

## 2017-06-03 MED ORDER — ASPIRIN EC 81 MG PO TBEC: 81.0000 mg | DELAYED_RELEASE_TABLET | Freq: Every day | ORAL | 0 refills | Status: AC

## 2017-06-03 MED ORDER — SODIUM CHLORIDE 0.9 % IV BOLUS (SEPSIS)
1000.0000 mL | Freq: Once | INTRAVENOUS | Status: AC
  Administered 2017-06-03: 1000 mL via INTRAVENOUS

## 2017-06-03 NOTE — ED Notes (Signed)
Pt up to toilet 

## 2017-06-03 NOTE — ED Notes (Signed)
Pt to MRI

## 2017-06-03 NOTE — ED Notes (Signed)
Patient transported to CT 

## 2017-06-03 NOTE — ED Provider Notes (Signed)
[facility] Emergency Department Provider Note  ____________________________________________  Time seen: Approximately 9:48 AM  I have reviewed the triage vital signs and the nursing notes.   HISTORY  Chief Complaint Numbness    HPI Carrie [name] is a 41 y.o. female a history of anxiety and depression presenting for left arm and leg numbness.The patient reports that after mowing the grass yesterday afternoon, she came indoors and noted she was having at "dull ache" on the anterior aspect of her left thigh. She felt this might be due to dehydration This morning she went to drop her son off and noticed some shooting pains that went from the thigh down the leg, with associated numbness and tingling of the left foot. She then noted that her left arm also had some numbness and tingling, most prominently in the fingers or just has not noticed any weakness, difficulty walking, lightheadedness or syncope, headache, speech changes or mental status changes. She has had some mild blurred vision. She denies any back pain. She does regularly use marijuana but denies cocaine or alcohol abuse.   Past Medical History:  Diagnosis Date  . Bartholin cyst   . Muscle spasm     Patient Active Problem List   Diagnosis Date Noted  . Anxiety and depression 11/14/2015  . Insomnia 11/14/2015  . 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 Tobacco user 10/24/2015    Past Surgical History:  Procedure Laterality Date  . CESAREAN SECTION    . TUBAL LIGATION      Current Outpatient Rx  . Order #: [id number] Class: Print  . Order #: [id number] Class: Normal    Allergies Patient has no known allergies.  Family History  Problem Relation Age of Onset  . Diabetes Maternal Grandmother   . Breast cancer Neg Hx   . Ovarian cancer Neg Hx   . Heart disease Neg Hx     Social History Social History  Substance Use Topics  . Smoking status: Current Every Day Smoker     Packs/day: 0.25    Types: Cigarettes  . Smokeless tobacco: Never Used  . Alcohol use No    Review of Systems Constitutional: No fever/chills.No lightheadedness or syncope. Eyes: Positive blurred vision. Negative flow.. ENT: No sore throat. No congestion or rhinorrhea. Cardiovascular: Denies chest pain. Denies palpitations. Respiratory: Denies shortness of breath.  No cough. Gastrointestinal: No abdominal pain.  No nausea, no vomiting.  No diarrhea.  No constipation. Genitourinary: Negative for dysuria. Musculoskeletal: Negative for neck or back pain. Shooting pains down the left leg. Skin: Negative for rash. Neurological: Negative for headaches. No weakness. No difficulty walking. Positive numbness and tingling of the left arm most notably in the left fingers, and the left foot.    ____________________________________________   PHYSICAL EXAM:  VITAL SIGNS: ED Triage Vitals  Enc Vitals Group     BP 06/03/17 0921 (!) 127/91     Pulse Rate 06/03/17 0921 69     Resp 06/03/17 0921 13     Temp 06/03/17 0921 97.7 F (36.5 C)     Temp Source 06/03/17 0921 Oral     SpO2 06/03/17 0921 100 %     Weight 06/03/17 0922 96 lb (43.5 kg)     Height 06/03/17 0922 5' (1.524 m)     Head Circumference --      Peak Flow --      Pain Score 06/03/17 0925 0     Pain Loc --  Pain Edu? --      Excl. in [place]? --     Constitutional: Alert and oriented. Well appearing and in no acute distress. Answers questions appropriately. Eyes: Conjunctivae are normal.  EOMI. PERRLA. No scleral icterus. Head: Atraumatic. Nose: No congestion/rhinnorhea. Mouth/Throat: Mucous membranes are moist.  Neck: No stridor.  Supple.  No meningismus. No tenderness to palpation, step-offs or deformities in the C-spine. Cardiovascular: Normal rate, regular rhythm. No murmurs, rubs or gallops.  Respiratory: Normal respiratory effort.  No accessory muscle use or retractions. Lungs CTAB.  No wheezes, rales or  ronchi. Gastrointestinal: Soft, nontender and nondistended.  No guarding or rebound.  No peritoneal signs. Musculoskeletal: No LE edema. No ttp in the calves or palpable cords.  Negative Homan's sign. No tenderness to palpation, step-offs or deformities in the thoracic or lumbar spine. Vascular: Normal DP and PT pulses as well as radial pulses bilaterally. Neurologic: Alert and oriented 3. Speech is clear. Face and smile symmetric. Tongue is midline. EOMI. PERRLA. No horizontal or vertical nystagmus.  No pronator drift. 5 out of 5 grip, biceps, triceps, hip flexors, plantar flexion and dorsiflexion. Normal sensation to light touch in the bilateral upper and lower extremities, and face. I am unable to reproduce the patient's numbness on my examination. Normal gait without ataxia. Skin:  Skin is warm, dry and intact. No rash noted. Psychiatric: Mood and affect are normal. Speech and behavior are normal.  Normal judgement.  ____________________________________________   LABS (all labs ordered are listed, but only abnormal results are displayed)  Labs Reviewed  CBC - Abnormal; Notable for the following:       Result Value   RDW 17.4 (*)    All other components within normal limits  COMPREHENSIVE METABOLIC PANEL - Abnormal; Notable for the following:    Potassium 3.4 (*)    Glucose, Bld 171 (*)    ALT 10 (*)    All other components within normal limits  URINALYSIS, COMPLETE (UACMP) WITH MICROSCOPIC - Abnormal; Notable for the following:    Color, Urine YELLOW (*)    APPearance HAZY (*)    Ketones, ur 5 (*)    Squamous Epithelial / LPF 6-30 (*)    All other components within normal limits  URINE DRUG SCREEN, QUALITATIVE ([facility] ONLY) - Abnormal; Notable for the following:    Cannabinoid 50 Ng, Ur [place] POSITIVE (*)    All other components within normal limits  DIFFERENTIAL  TROPONIN I  ETHANOL  CBG MONITORING, ED   ____________________________________________  EKG  ED ECG REPORT I,  Eula Listen, the attending physician, personally viewed and interpreted this ECG.   Date: 06/03/2017  EKG Time: 927  Rate: 70  Rhythm: normal sinus rhythm  Axis: normal  Intervals:none  ST&T Change: Nonspecific T-wave inversion in V1 and V2. No STEMI.  ____________________________________________  RADIOLOGY  Ct Head Wo Contrast  Result Date: 06/03/2017 CLINICAL DATA:  Sensory symptoms in the LEFT arm and leg. This began earlier today. EXAM: CT HEAD WITHOUT CONTRAST TECHNIQUE: Contiguous axial images were obtained from the base of the skull through the vertex without intravenous contrast. COMPARISON:  None. FINDINGS: Brain: No evidence for acute infarction, hemorrhage, mass lesion, hydrocephalus, or extra-axial fluid. Normal cerebral volume. No white matter disease. Vascular: No hyperdense vessel or unexpected calcification. Skull: Normal. Negative for fracture or focal lesion. Sinuses/Orbits: No acute finding. Other: None. IMPRESSION: Negative exam. Electronically Signed   By: Staci Righter M.D.   On: 06/03/2017 10:05   Mr Brain  Wo Contrast  Result Date: 06/03/2017 CLINICAL DATA:  Sensory symptoms in the LEFT arm and leg which began earlier today. EXAM: MRI HEAD WITHOUT CONTRAST TECHNIQUE: Multiplanar, multiecho pulse sequences of the brain and surrounding structures were obtained without intravenous contrast. COMPARISON:  Normal CT head performed at 9:45 a.m. FINDINGS: Brain: No acute infarction, hemorrhage, hydrocephalus, extra-axial collection or mass lesion. Normal cerebral volume. No significant white matter disease. Vascular: Normal flow voids. Skull and upper cervical spine: Slight tonsillar ectopia without frank Chiari I malformation. Diffuse low signal intensity bone marrow in the upper cervical region, could be related to tobacco use. No focal areas of marrow signal abnormality. No upper cervical lesions. Sinuses/Orbits: Negative. Other: None. IMPRESSION: Negative exam.  No  cause is seen for the reported symptoms. Electronically Signed   By: Staci Righter M.D.   On: 06/03/2017 11:53    ____________________________________________   PROCEDURES  Procedure(s) performed: None  Procedures  Critical Care performed: No ____________________________________________   INITIAL IMPRESSION / ASSESSMENT AND PLAN / ED COURSE  Pertinent labs & imaging results that were available during my care of the patient were reviewed by me and considered in my medical decision making (see chart for details).  41 y.o. female presenting with left arm and leg numbness, and blurred vision. Overall, the patient is hemodynamically stable. I am unable to reproduce any neurologic abnormalities on examination, but the sensory deficits are concerning and will warrant further evaluation. She'll get a CT of the head and if that is negative proceed with MRI. Acute CVA is unlikely given her lack of risk factors, but it is on the differential. Multiple sclerosis is also possible. It is unlikely that she has a central spine cause for her symptoms given that she has no neck or back pain, or recent trauma. We also check electrolytes. Then reevaluation for final disposition.  ----------------------------------------- 11:21 AM on 06/03/2017 -----------------------------------------  The patient continues to be stable at this time. She is sitting comfortably in the bed and has not noted new symptoms. She is mildly hypokalemic, and I will supplement her for this. Her CT scan does not show any acute intracranial process, and we'll proceed with an MR of the brain for further evaluation. I spoke with Dr. Doy Mince, the neurologist on-call today, who agrees with the plan for MR imaging. If this is negative, her recommendation is to start the patient on a low-dose aspirin and have her follow-up with an outpatient neurologist. Plan reevaluation for final  disposition.  ----------------------------------------- 12:28 PM on 06/03/2017 -----------------------------------------  The patient's workup in the emergency department has been reassuring. Her MR is negative, and I have encouraged her to stop her marijuana use to see if this helped her symptoms. At this time, the patient is safe for discharge and she will go home with a prescription for low-dose aspirin and instructions to follow up with a neurologist. Return precautions were discussed.  ____________________________________________  FINAL CLINICAL IMPRESSION(S) / ED DIAGNOSES  Final diagnoses:  Numbness and tingling of left arm and leg  Left leg pain  Blurred vision, bilateral  Marijuana use         NEW MEDICATIONS STARTED DURING THIS VISIT:  New Prescriptions   ASPIRIN EC 81 MG TABLET    Take 1 tablet (81 mg total) by mouth daily.      Eula Listen, MD 06/03/17 1229

## 2017-06-03 NOTE — ED Triage Notes (Signed)
Pt c/o aching pain in left leg last night, states this morning woke up with tingling/numbness in left leg and arm. Denies any facial numbness.Marland Kitchen

## 2017-06-03 NOTE — Discharge Instructions (Signed)
Please make an appointment with a neurologist for follow-up. Please take a low-dose aspirin daily and discuss this medication with the neurologist.  Please stop using marijuana.  Return to the emergency department if you develop severe headache, visual or speech changes, numbness tingling or weakness, chest pain, or any other symptoms concerning to you.

## 2018-05-05 IMAGING — MR MR HEAD W/O CM
10 series · 40 of 48 positions shown · non-contrast
Comparison: Normal CT head performed at [DATE] a.m.

CLINICAL DATA: Sensory symptoms in the LEFT arm and leg which began
earlier today.

EXAM:
MRI HEAD WITHOUT CONTRAST
TECHNIQUE: Multiplanar, multiecho pulse sequences of the brain and surrounding
structures were obtained without intravenous contrast.

[Series 4: DWI · axial · 3.0mm · 0.94mm/px · z∈[-63,+87]mm · 6 of 51 slices shown (1 of 2)]
[im 1/51]
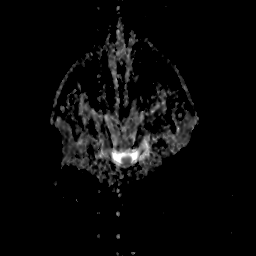
[im 11/51]
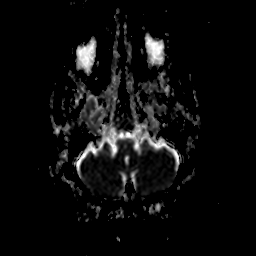
[im 21/51]
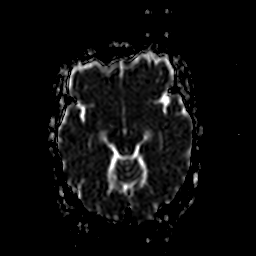
[im 31/51]
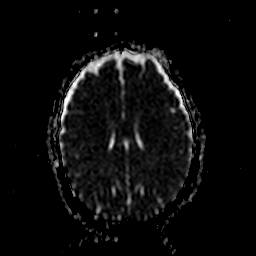
[im 41/51]
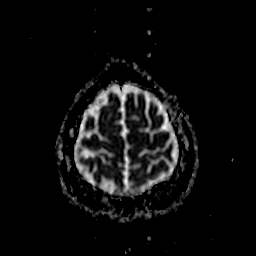
[im 51/51]
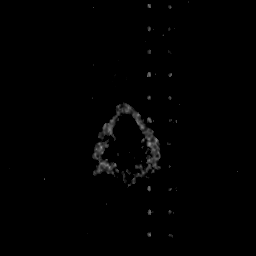

[Series 6: DWI · coronal · 5.0mm · 1.80mm/px · 4 of 40 slices shown (2 of 2)]
[im 1/40]
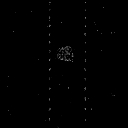
[im 14/40]
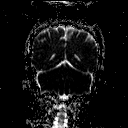
[im 27/40]
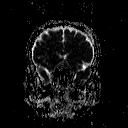
[im 40/40]
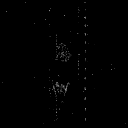

[Series 7: T1 · sagittal · 5.0mm · 0.47mm/px · 2 of 23 slices shown (1 of 2)]
[im 1/23]
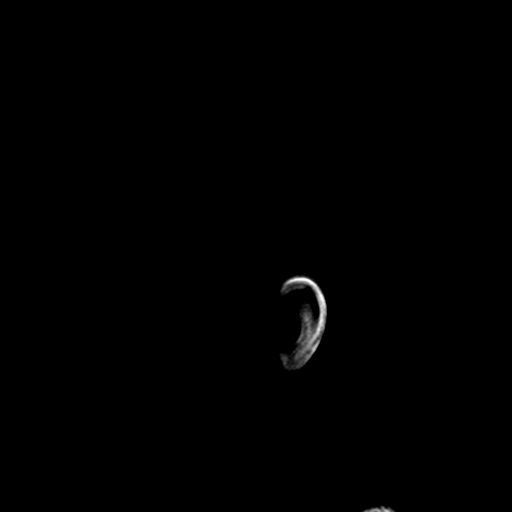
[im 23/23]
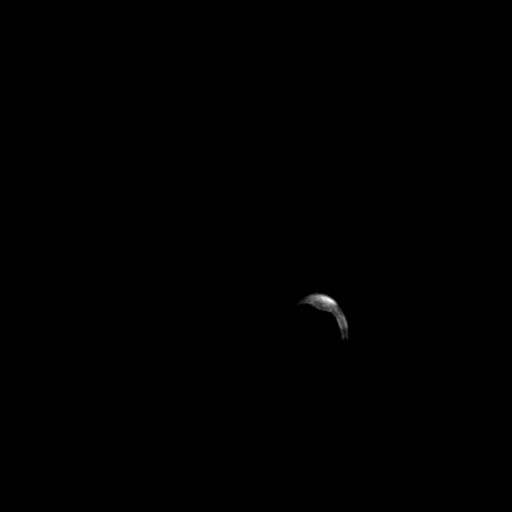

[Series 8: T2 · axial · 5.0mm · 0.45mm/px · z∈[-70,+83]mm · 2 of 23 slices shown (1 of 3)]
[im 1/23]
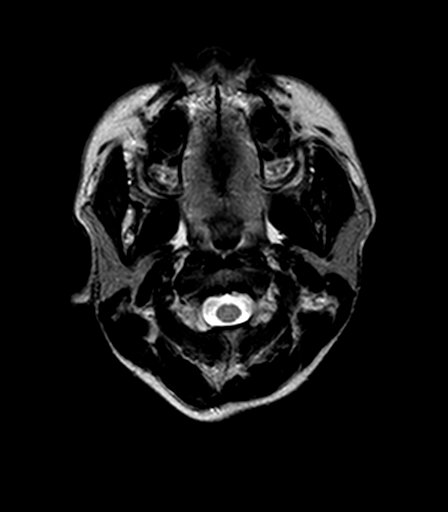
[im 23/23]
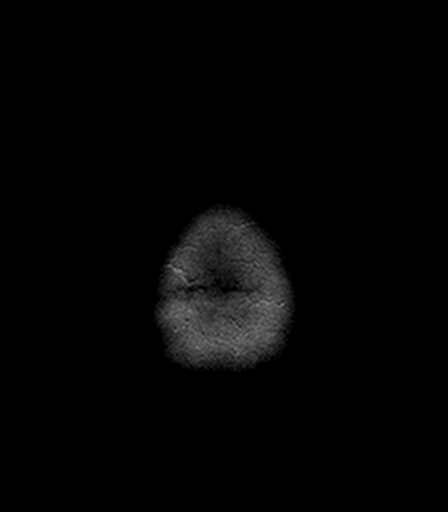

[Series 9: FLAIR · axial · 3.0mm · 0.90mm/px · z∈[-83,+70]mm · 5 of 52 slices shown]
[im 1/52]
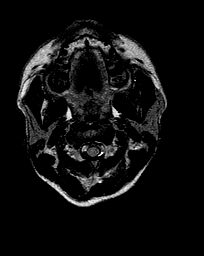
[im 13/52]
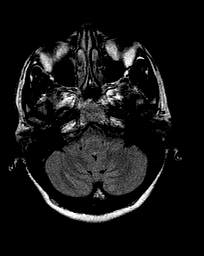
[im 26/52]
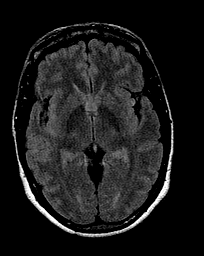
[im 39/52]
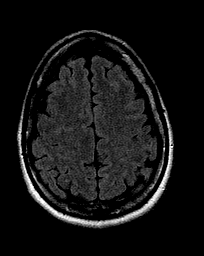
[im 52/52]
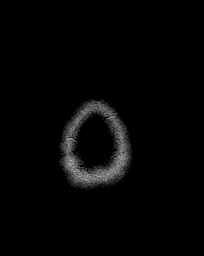

[Series 10: ax (id) · axial · 3.0mm · 0.94mm/px · z∈[-63,+87]mm · 5 of 51 slices shown]
[im 1/51]
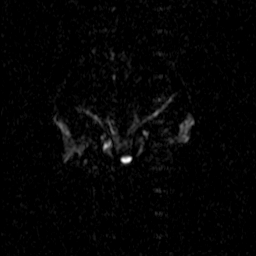
[im 13/51]
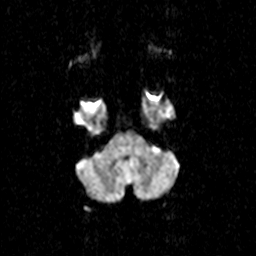
[im 26/51]
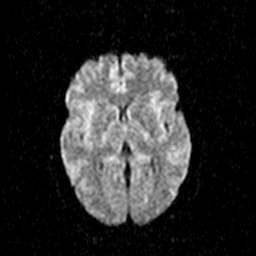
[im 38/51]
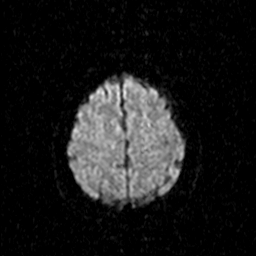
[im 51/51]
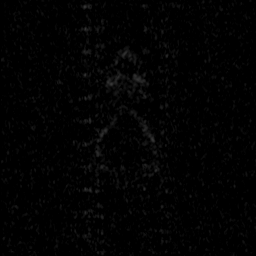

[Series 11: cor (id) · coronal · 5.0mm · 1.80mm/px · 3 of 35 slices shown]
[im 1/35]
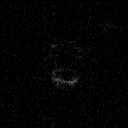
[im 18/35]
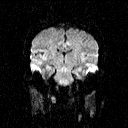
[im 35/35]
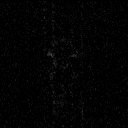

[Series 12: T2 · axial · 5.0mm · 0.45mm/px · z∈[-70,+83]mm · 2 of 23 slices shown (2 of 3)]
[im 1/23]
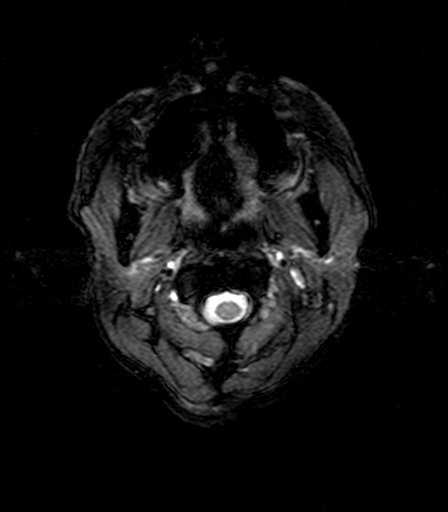
[im 23/23]
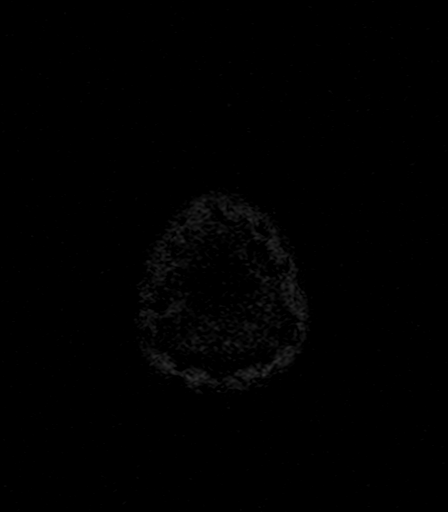

[Series 13: T1 · axial · 1.0mm · 0.45mm/px · z∈[-72,+77]mm · 8 of 160 slices shown (2 of 2)]
[im 11/160]
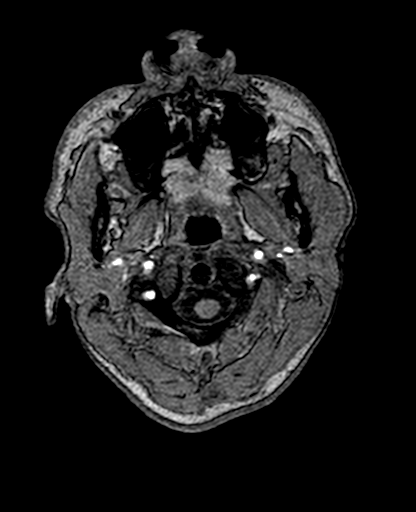
[im 32/160]
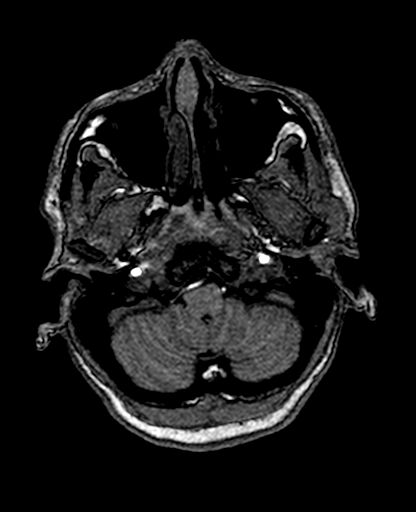
[im 54/160]
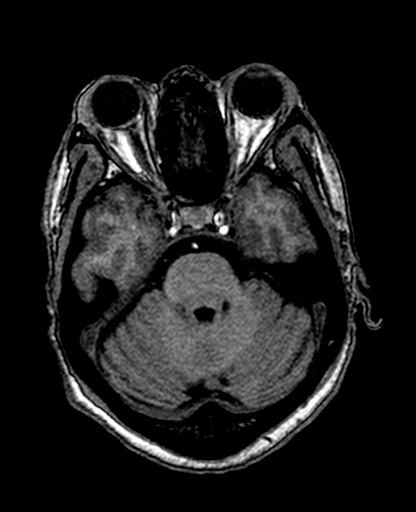
[im 75/160]
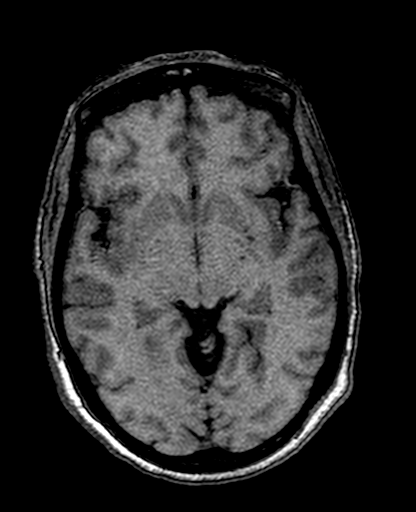
[im 96/160]
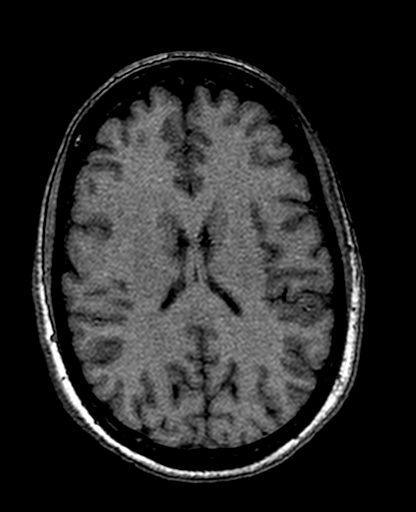
[im 117/160]
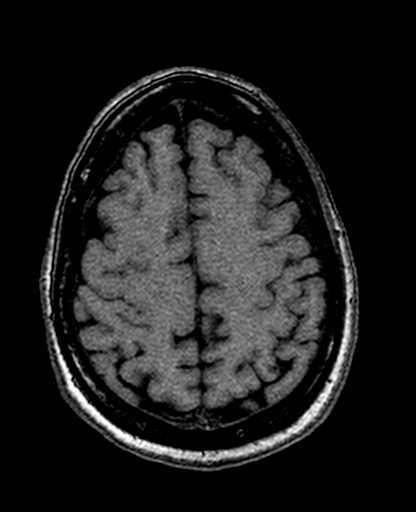
[im 138/160]
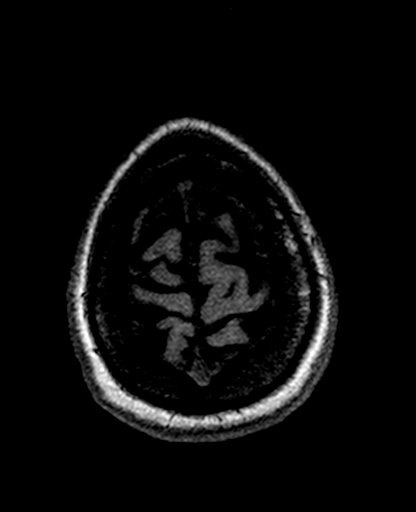
[im 160/160]
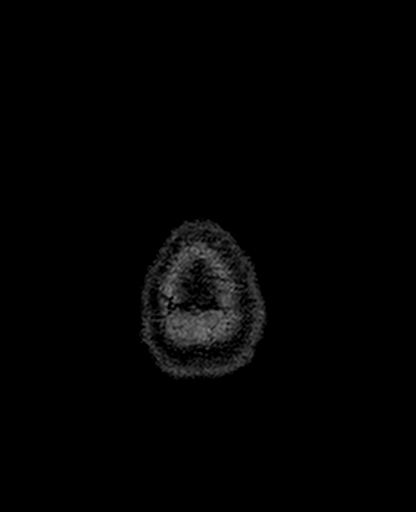

[Series 14: T2 · coronal · 5.0mm · 0.45mm/px · 3 of 30 slices shown (3 of 3)]
[im 1/30]
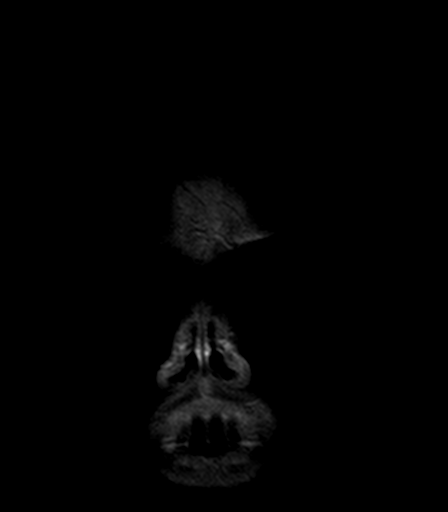
[im 15/30]
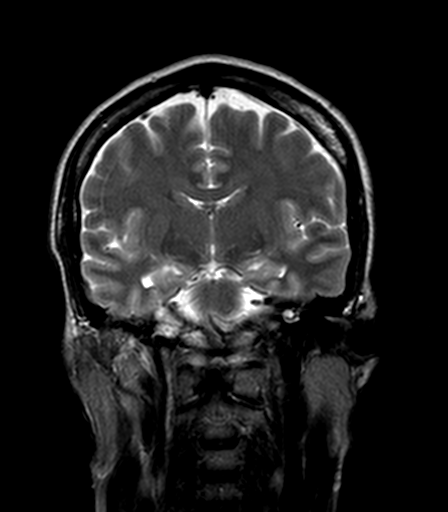
[im 30/30]
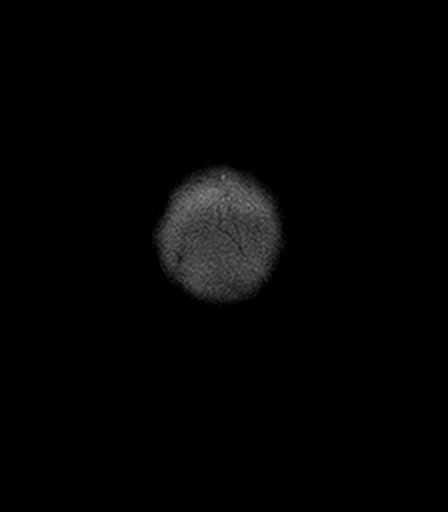

[40 of 48 positions shown; findings below may reference images not displayed]

FINDINGS: Brain: No acute infarction, hemorrhage, hydrocephalus, extra-axial
collection or mass lesion. Normal cerebral volume. No significant
white matter disease.

Vascular: Normal flow voids.

Skull and upper cervical spine: Slight tonsillar ectopia without
frank Chiari I malformation. Diffuse low signal intensity bone
marrow in the upper cervical region, could be related to tobacco
use. No focal areas of marrow signal abnormality. No upper cervical
lesions.

Sinuses/Orbits: Negative.

Other: None.
IMPRESSION: Negative exam.  No cause is seen for the reported symptoms.

## 2018-05-05 IMAGING — CT CT HEAD W/O CM
3 series · 16 of 46 positions shown, 19 images · non-contrast
Comparison: None.

CLINICAL DATA: Sensory symptoms in the LEFT arm and leg. This began
earlier today.

EXAM:
CT HEAD WITHOUT CONTRAST
TECHNIQUE: Contiguous axial images were obtained from the base of the skull
through the vertex without intravenous contrast.

[Series 2: head wo · axial · 0.39mm/px · z∈[-151,-31]mm · 10 of 29 slices shown, 13 images]
[im 3/29  brain]
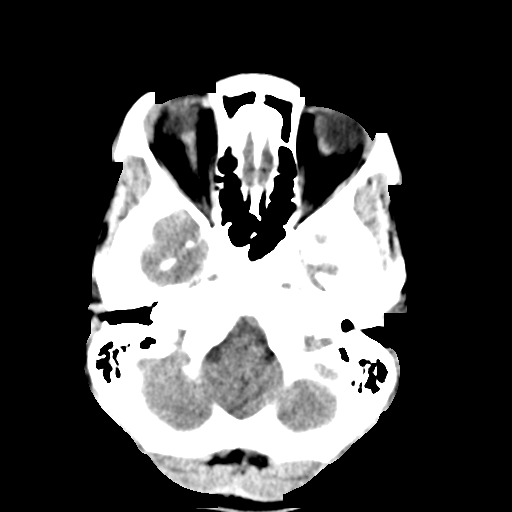
[im 3/29  bone]
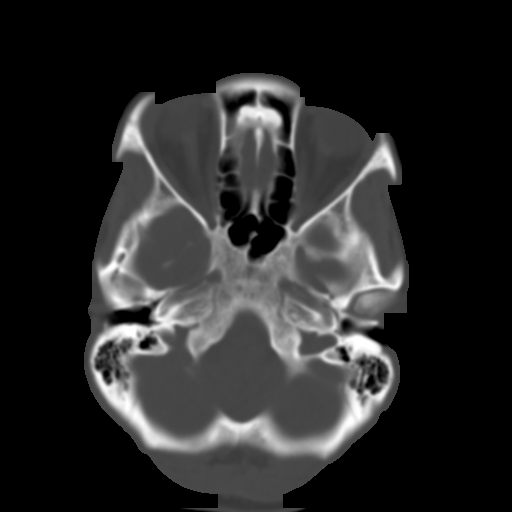
[im 6/29  brain]
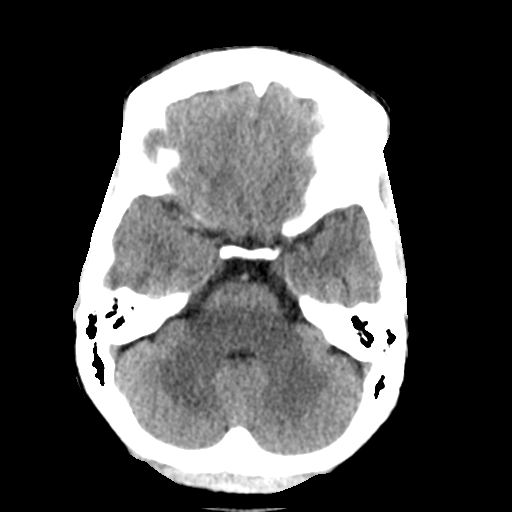
[im 8/29  brain]
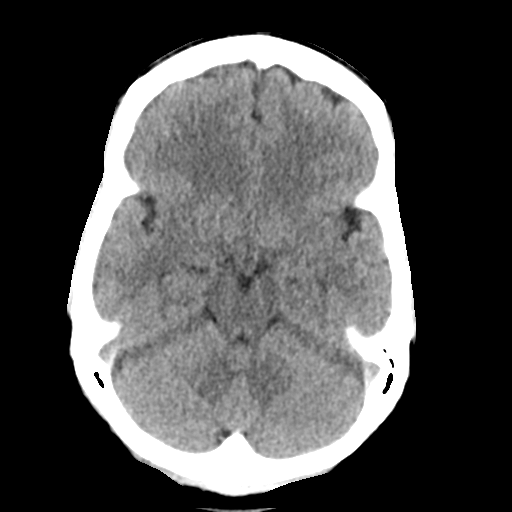
[im 11/29  brain]
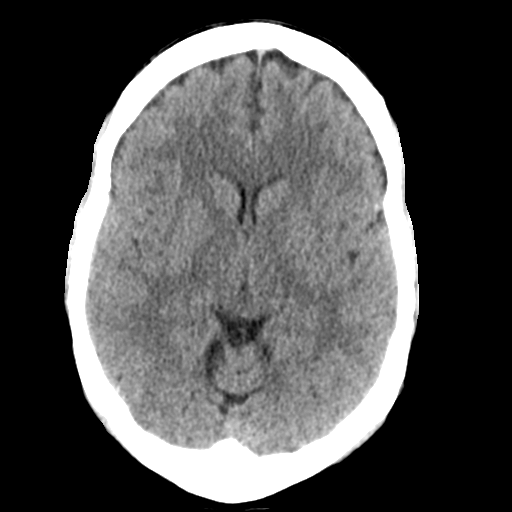
[im 14/29  brain]
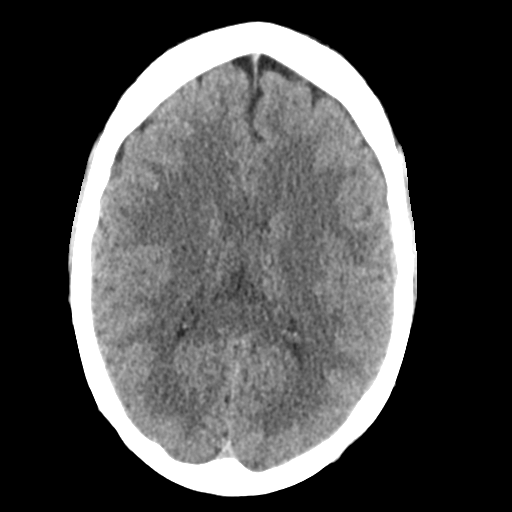
[im 14/29  bone]
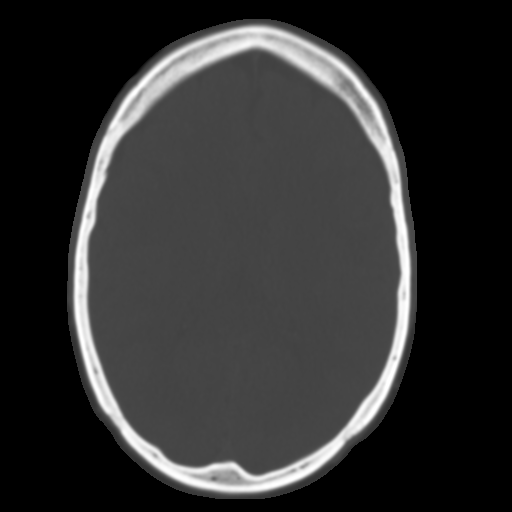
[im 16/29  brain]
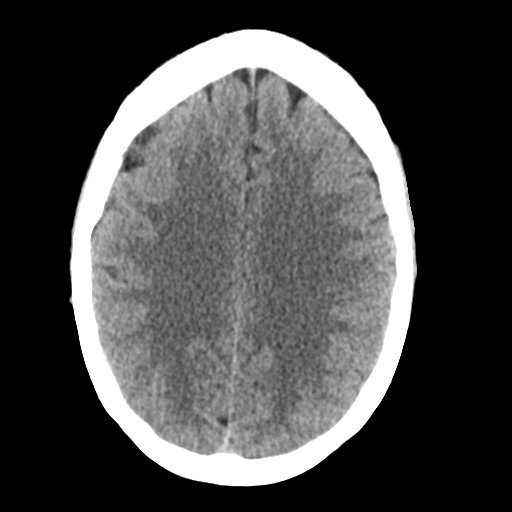
[im 19/29  brain]
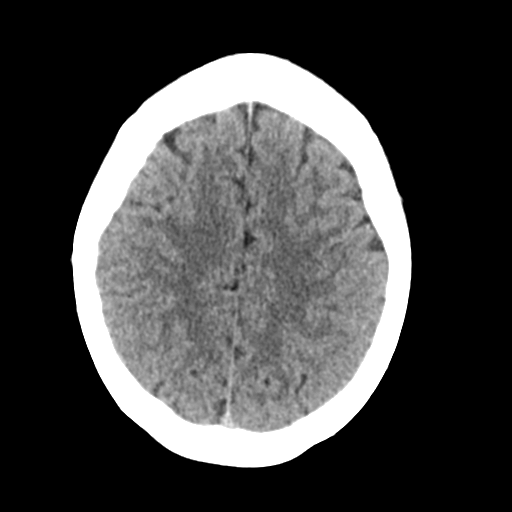
[im 22/29  brain]
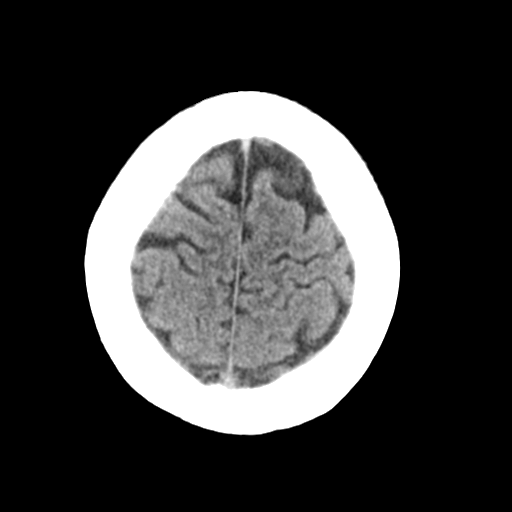
[im 24/29  brain]
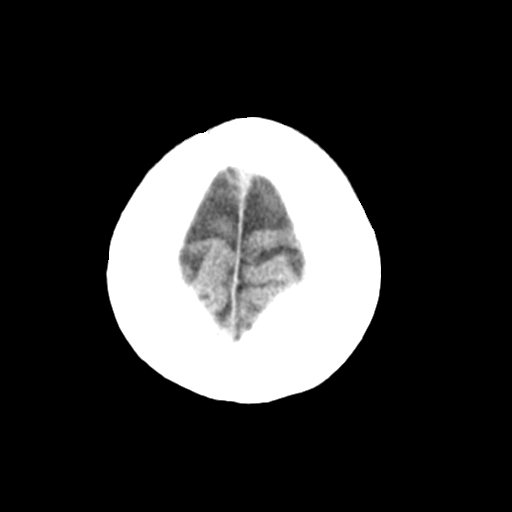
[im 24/29  bone]
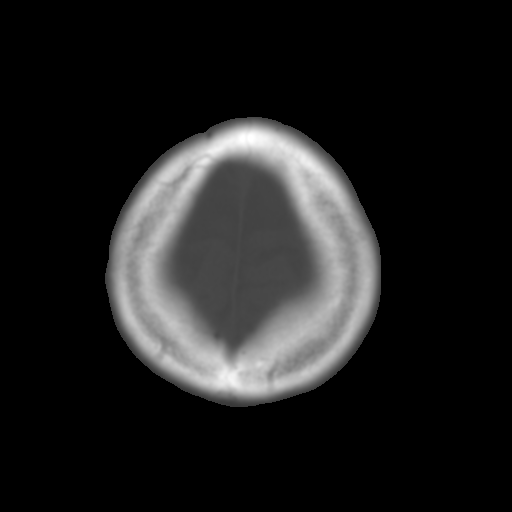
[im 27/29  brain]
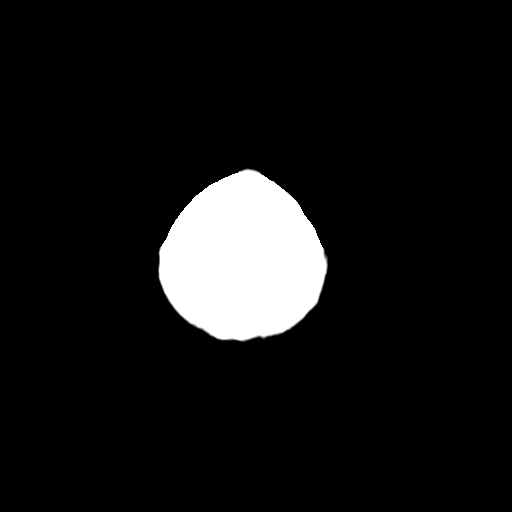

[Series 4: coronal soft tissue · coronal · 0.29mm/px · 3 of 67 slices shown]
[im 23/67  brain]
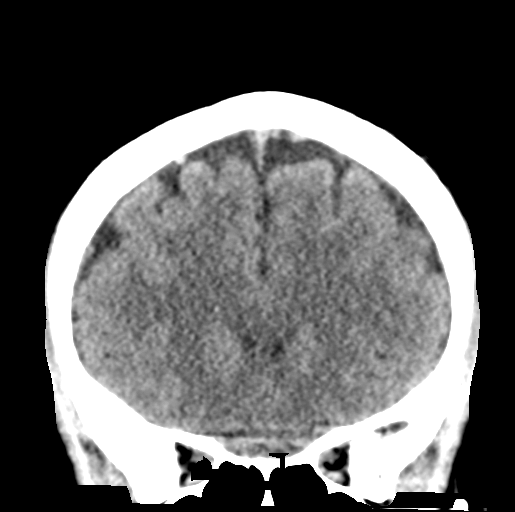
[im 30/67  brain]
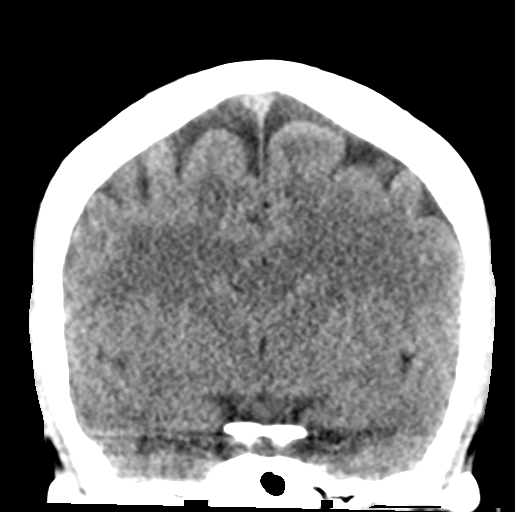
[im 37/67  brain]
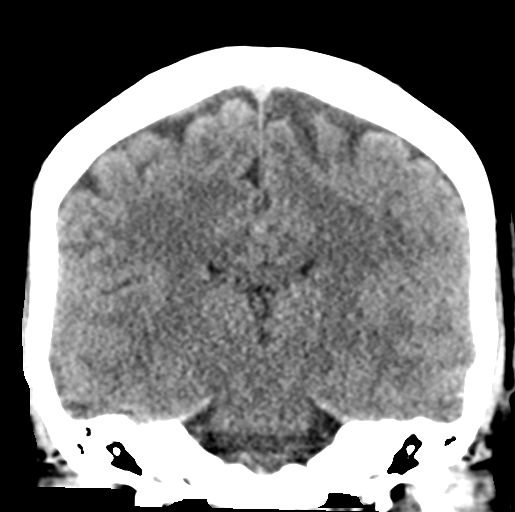

[Series 5: sagittal soft tissue · sagittal · 0.29mm/px · 3 of 50 slices shown]
[im 17/50  brain]
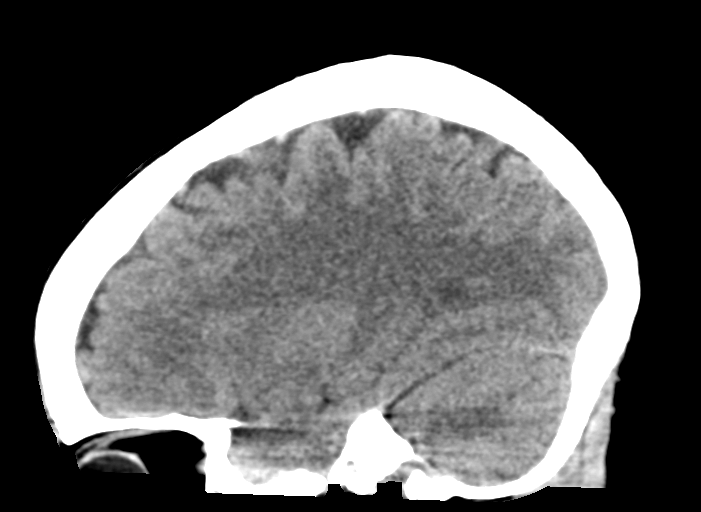
[im 25/50  brain]
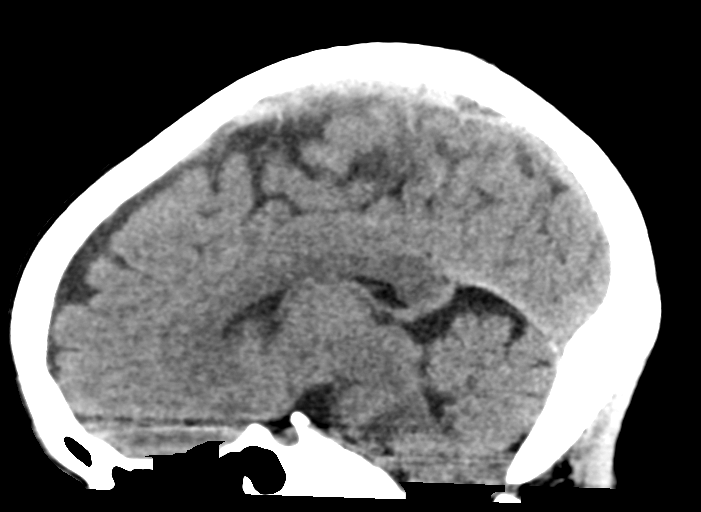
[im 33/50  brain]
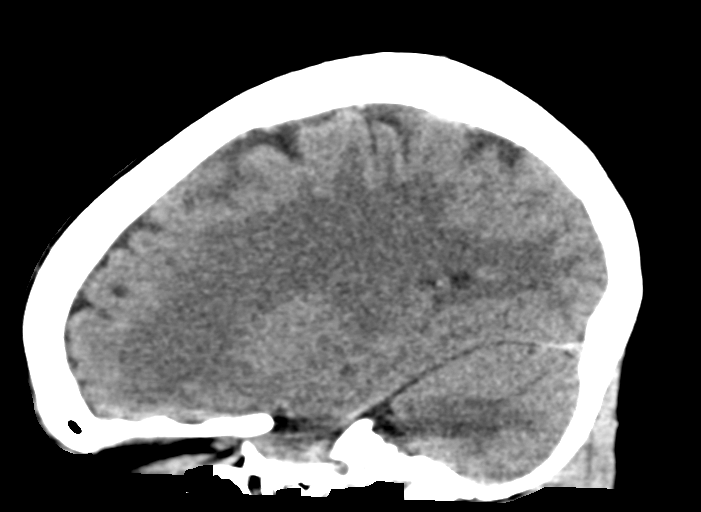

[16 of 46 positions shown; findings below may reference images not displayed]

FINDINGS: Brain: No evidence for acute infarction, hemorrhage, mass lesion,
hydrocephalus, or extra-axial fluid. Normal cerebral volume. No
white matter disease.

Vascular: No hyperdense vessel or unexpected calcification.

Skull: Normal. Negative for fracture or focal lesion.

Sinuses/Orbits: No acute finding.

Other: None.
IMPRESSION: Negative exam.

## 2018-09-01 ENCOUNTER — Emergency Department
Admission: EM | Admit: 2018-09-01 | Discharge: 2018-09-01 | Disposition: A | Discharge status: Home / Self Care | Payer: Managed Care, Other (non HMO) | Attending: Emergency Medicine | Admitting: Emergency Medicine

## 2018-09-01 ENCOUNTER — Emergency Department: Payer: Managed Care, Other (non HMO)

## 2018-09-01 ENCOUNTER — Encounter: Payer: Self-pay | Admitting: Emergency Medicine

## 2018-09-01 ENCOUNTER — Other Ambulatory Visit: Payer: Self-pay

## 2018-09-01 DIAGNOSIS — F17200 Nicotine dependence, unspecified, uncomplicated: Secondary | ICD-10-CM

## 2018-09-01 DIAGNOSIS — J40 Bronchitis, not specified as acute or chronic: Secondary | ICD-10-CM

## 2018-09-01 DIAGNOSIS — Z79899 Other long term (current) drug therapy: Secondary | ICD-10-CM | POA: Insufficient documentation

## 2018-09-01 DIAGNOSIS — F17210 Nicotine dependence, cigarettes, uncomplicated: Secondary | ICD-10-CM | POA: Insufficient documentation

## 2018-09-01 LAB — INFLUENZA PANEL BY PCR (TYPE A & B)
INFLAPCR: NEGATIVE
INFLBPCR: NEGATIVE

## 2018-09-01 MED ORDER — NAPROXEN 500 MG PO TABS
500.0000 mg | ORAL_TABLET | Freq: Once | ORAL | Status: AC
  Administered 2018-09-01: 500 mg via ORAL
  Filled 2018-09-01: qty 1

## 2018-09-01 MED ORDER — FEXOFENADINE-PSEUDOEPHED ER 60-120 MG PO TB12: 1.0000 | ORAL_TABLET | Freq: Two times a day (BID) | ORAL | 0 refills | Status: DC

## 2018-09-01 MED ORDER — IBUPROFEN 600 MG PO TABS: 600.0000 mg | ORAL_TABLET | Freq: Three times a day (TID) | ORAL | 0 refills | Status: DC | PRN

## 2018-09-01 MED ORDER — BENZONATATE 100 MG PO CAPS
200.0000 mg | ORAL_CAPSULE | Freq: Once | ORAL | Status: AC
Start: 2018-09-01 — End: 2018-09-01
  Administered 2018-09-01: 200 mg via ORAL
  Filled 2018-09-01: qty 2

## 2018-09-01 MED ORDER — ALBUTEROL SULFATE HFA 108 (90 BASE) MCG/ACT IN AERS: 2.0000 | INHALATION_SPRAY | Freq: Four times a day (QID) | RESPIRATORY_TRACT | 2 refills | Status: DC | PRN

## 2018-09-01 MED ORDER — BENZONATATE 100 MG PO CAPS: 200.0000 mg | ORAL_CAPSULE | Freq: Three times a day (TID) | ORAL | 0 refills | Status: AC | PRN

## 2018-09-01 NOTE — ED Notes (Signed)
Pt reports feeling feverish with cough and cold like Sx last week which seemed to resolve over the weekend. Sx returned yesterday. Stated Sx are non-productive cough, chills, generalized body aches, and weakness. Pt reports taking OTC nyquill, tylenol and aleve. Last self medication reported as 1 aleve tablet yesterday. Denies SoB

## 2018-09-01 NOTE — ED Provider Notes (Signed)
[facility] Emergency Department Provider Note   ____________________________________________   First MD Initiated Contact with Patient 09/01/18 1042     (approximate)  I have reviewed the triage vital signs and the nursing notes.   HISTORY  Chief Complaint Cough    HPI Carrie [name] is a 42 y.o. female patient complain of cough, chest congestion, fever/chills for 5 days.  Patient state 2 days ago she started feeling better but cough and congestion return back yesterday.  Patient smokes 1/2 pack cigarettes per day.  Patient denies nausea, vomiting, diarrhea.  Patient rates the pain discomfort a 7/10.  Patient describes her pain is "aching".  No palliative measures for complaint.  Past Medical History:  Diagnosis Date  . Bartholin cyst   . Muscle spasm     Patient Active Problem List   Diagnosis Date Noted  . Anxiety and depression 11/14/2015  . Insomnia 11/14/2015  . 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 Tobacco user 10/24/2015    Past Surgical History:  Procedure Laterality Date  . CESAREAN SECTION    . TUBAL LIGATION      Prior to Admission medications   Medication Sig Start Date End Date Taking? Authorizing Provider  albuterol (PROVENTIL HFA;VENTOLIN HFA) 108 (90 Base) MCG/ACT inhaler Inhale 2 puffs into the lungs every 6 (six) hours as needed for wheezing or shortness of breath. 09/01/18   Sable Feil, PA-C  benzonatate (TESSALON PERLES) 100 MG capsule Take 2 capsules (200 mg total) by mouth 3 (three) times daily as needed. 09/01/18 09/01/19  Sable Feil, PA-C  fexofenadine-pseudoephedrine (ALLEGRA-D) 60-120 MG 12 hr tablet Take 1 tablet by mouth 2 (two) times daily. 09/01/18   Sable Feil, PA-C  ibuprofen (ADVIL,MOTRIN) 600 MG tablet Take 1 tablet (600 mg total) by mouth every 8 (eight) hours as needed. 09/01/18   Sable Feil, PA-C  sertraline (ZOLOFT) 50 MG tablet Take 1 tablet (50 mg total) by mouth at  bedtime. Take 1/2 tab for 7 days then daily. Patient not taking: Reported on 06/03/2017 11/14/15   Defrancesco, Alanda Slim, MD    Allergies Patient has no known allergies.  Family History  Problem Relation Age of Onset  . Diabetes Maternal Grandmother   . Breast cancer Neg Hx   . Ovarian cancer Neg Hx   . Heart disease Neg Hx     Social History Social History   Tobacco Use  . Smoking status: Current Every Day Smoker    Packs/day: 0.25    Types: Cigarettes  . Smokeless tobacco: Never Used  Substance Use Topics  . Alcohol use: No  . Drug use: No    Review of Systems  Constitutional: No fever/chills Eyes: No visual changes. ENT: No sore throat. Cardiovascular: Denies chest pain. Respiratory: Denies shortness of breath. Gastrointestinal: No abdominal pain.  No nausea, no vomiting.  No diarrhea.  No constipation. Genitourinary: Negative for dysuria. Musculoskeletal: Negative for back pain. Skin: Negative for rash. Neurological: Negative for headaches, focal weakness or numbness. Psychiatric:Anxiety and depression. ____________________________________________   PHYSICAL EXAM:  VITAL SIGNS: ED Triage Vitals  Enc Vitals Group     BP 09/01/18 1040 (!) 154/88     Pulse Rate 09/01/18 1040 75     Resp 09/01/18 1040 18     Temp 09/01/18 1040 97.8 F (36.6 C)     Temp Source 09/01/18 1040 Oral     SpO2 09/01/18 1040 99 %     Weight  09/01/18 1036 100 lb (45.4 kg)     Height 09/01/18 1036 5' (1.524 m)     Head Circumference --      Peak Flow --      Pain Score 09/01/18 1035 7     Pain Loc --      Pain Edu? --      Excl. in [place]? --    Constitutional: Alert and oriented. Well appearing and in no acute distress. Eyes: Conjunctivae are normal. PERRL. EOMI. Head: Atraumatic. Nose: No congestion/rhinnorhea. Mouth/Throat: Mucous membranes are moist.  Oropharynx non-erythematous. Neck: No stridor. Hematological/Lymphatic/Immunilogical: No cervical  lymphadenopathy. Cardiovascular: Normal rate, regular rhythm. Grossly normal heart sounds.  Good peripheral circulation. Respiratory: Normal respiratory effort.  No retractions. Lungs CTAB. Gastrointestinal: Soft and nontender. No distention. No abdominal bruits. No CVA tenderness. Musculoskeletal: No lower extremity tenderness nor edema.  No joint effusions. Neurologic:  Normal speech and language. No gross focal neurologic deficits are appreciated. No gait instability. Skin:  Skin is warm, dry and intact. No rash noted. Psychiatric: Mood and affect are normal. Speech and behavior are normal.  ____________________________________________   LABS (all labs ordered are listed, but only abnormal results are displayed)  Labs Reviewed  INFLUENZA PANEL BY PCR (TYPE A & B)   ____________________________________________  EKG   ____________________________________________  RADIOLOGY  ED MD interpretation:  Official radiology report(s): Dg Chest 2 View  Result Date: 09/01/2018 CLINICAL DATA:  Cough and chills over the past 4 days with temporary improvement and now recurrence of symptoms. Current smoker. EXAM: CHEST - 2 VIEW COMPARISON:  None in PACs FINDINGS: The lungs are hyperinflated with hemidiaphragm flattening. There is no focal infiltrate. There is no pleural effusion or pneumothorax. The heart and mediastinal structures are normal. The pulmonary vascularity is not engorged. The bony thorax exhibits no acute abnormality. IMPRESSION: Hyperinflation consistent with acute or chronic bronchitis and the patient's smoking history. No pneumonia, CHF, nor other acute cardiopulmonary abnormality. Electronically Signed   By: David  Martinique M.D.   On: 09/01/2018 11:07    ____________________________________________   PROCEDURES  Procedure(s) performed: None  Procedures  Critical Care performed: No  ____________________________________________   INITIAL IMPRESSION / ASSESSMENT AND PLAN  / ED COURSE  As part of my medical decision making, I reviewed the following data within the electronic MEDICAL RECORD NUMBER    Cough and congestion secondary to chronic bronchitis.  Discussed x-ray findings with patient.  Discussed cigarette smoker cessation with patient.  Patient given discharge care instruction advised take medication as directed.  Patient advised to follow-up with the [facility] clinic to establish care.      ____________________________________________   FINAL CLINICAL IMPRESSION(S) / ED DIAGNOSES  Final diagnoses:  Bronchitis  Cigarette smoker motivated to quit     ED Discharge Orders         Ordered    benzonatate (TESSALON PERLES) 100 MG capsule  3 times daily PRN     09/01/18 1226    albuterol (PROVENTIL HFA;VENTOLIN HFA) 108 (90 Base) MCG/ACT inhaler  Every 6 hours PRN     09/01/18 1226    fexofenadine-pseudoephedrine (ALLEGRA-D) 60-120 MG 12 hr tablet  2 times daily     09/01/18 1226    ibuprofen (ADVIL,MOTRIN) 600 MG tablet  Every 8 hours PRN     09/01/18 1226           Note:  This document was prepared using Dragon voice recognition software and may include unintentional dictation errors.    Tamala Julian,  Hinda Lenis, PA-C 09/01/18 1228    Earleen Newport, MD 09/02/18 [phone]

## 2018-09-01 NOTE — ED Triage Notes (Signed)
Pt states cough and chills over the weekend then started feeling better, now cough and chills are back. Appears in NAD.

## 2018-09-01 NOTE — ED Notes (Signed)
Warm blanket given as per patient request

## 2018-09-01 NOTE — Discharge Instructions (Signed)
Follow-up with the [facility] clinic for smoking cessation programs.

## 2019-07-15 ENCOUNTER — Other Ambulatory Visit: Payer: Self-pay | Admitting: Family Medicine

## 2019-07-15 DIAGNOSIS — Z1231 Encounter for screening mammogram for malignant neoplasm of breast: Secondary | ICD-10-CM

## 2019-09-08 ENCOUNTER — Ambulatory Visit
Admission: RE | Admit: 2019-09-08 | Discharge: 2019-09-08 | Disposition: A | Discharge status: Home / Self Care | Payer: Medicaid Other | Source: Ambulatory Visit | Attending: Family Medicine | Admitting: Family Medicine

## 2019-09-08 DIAGNOSIS — Z1231 Encounter for screening mammogram for malignant neoplasm of breast: Secondary | ICD-10-CM | POA: Insufficient documentation

## 2020-10-03 ENCOUNTER — Encounter: Payer: Self-pay | Admitting: Family Medicine

## 2020-10-12 ENCOUNTER — Other Ambulatory Visit: Payer: Self-pay

## 2020-10-12 ENCOUNTER — Telehealth (INDEPENDENT_AMBULATORY_CARE_PROVIDER_SITE_OTHER): Payer: Self-pay | Admitting: Gastroenterology

## 2020-10-12 DIAGNOSIS — Z800 Family history of malignant neoplasm of digestive organs: Secondary | ICD-10-CM

## 2020-10-12 DIAGNOSIS — Z1211 Encounter for screening for malignant neoplasm of colon: Secondary | ICD-10-CM

## 2020-10-12 MED ORDER — NA SULFATE-K SULFATE-MG SULF 17.5-3.13-1.6 GM/177ML PO SOLN: 1.0000 | Freq: Once | ORAL | 0 refills | Status: AC

## 2020-10-12 NOTE — Progress Notes (Signed)
Gastroenterology Pre-Procedure Review  Request Date: Thursday 10/26/20 Requesting Physician: Dr. Vicente Males  PATIENT REVIEW QUESTIONS: The patient responded to the following health history questions as indicated:    1. Are you having any GI issues? no 2. Do you have a personal history of Polyps? no 3. Do you have a family history of Colon Cancer or Polyps? yes (cousin just diagnosed with stage 4 colon cancer, maternal aunt and maternal uncles had colon cancer) 4. Diabetes Mellitus? no 5. Joint replacements in the past 12 months?no 6. Major health problems in the past 3 months?no 7. Any artificial heart valves, MVP, or defibrillator?no    MEDICATIONS & ALLERGIES:    Patient reports the following regarding taking any anticoagulation/antiplatelet therapy:   Plavix, Coumadin, Eliquis, Xarelto, Lovenox, Pradaxa, Brilinta, or Effient? no Aspirin? no  Patient confirms/reports the following medications:  Current Outpatient Medications  Medication Sig Dispense Refill  . fexofenadine-pseudoephedrine (ALLEGRA-D) 60-120 MG 12 hr tablet Take 1 tablet by mouth 2 (two) times daily. 20 tablet 0  . ibuprofen (ADVIL,MOTRIN) 600 MG tablet Take 1 tablet (600 mg total) by mouth every 8 (eight) hours as needed. 15 tablet 0  . zolpidem (AMBIEN CR) 6.25 MG CR tablet Take by mouth.    Marland Kitchen albuterol (PROVENTIL HFA;VENTOLIN HFA) 108 (90 Base) MCG/ACT inhaler Inhale 2 puffs into the lungs every 6 (six) hours as needed for wheezing or shortness of breath. (Patient not taking: Reported on 10/12/2020) 1 Inhaler 2  . Na Sulfate-K Sulfate-Mg Sulf 17.5-3.13-1.6 GM/177ML SOLN Take 1 kit by mouth once for 1 dose. 354 mL 0  . sertraline (ZOLOFT) 50 MG tablet Take 1 tablet (50 mg total) by mouth at bedtime. Take 1/2 tab for 7 days then daily. (Patient not taking: Reported on 06/03/2017) 30 tablet 6   No current facility-administered medications for this visit.    Patient confirms/reports the following allergies:  No Known  Allergies  No orders of the defined types were placed in this encounter.   AUTHORIZATION INFORMATION Primary Insurance: 1D#: Group #:  Secondary Insurance: 1D#: Group #:  SCHEDULE INFORMATION: Date: Thursday 10/26/20 Time: Location:[facility]

## 2020-10-19 ENCOUNTER — Other Ambulatory Visit: Payer: Self-pay | Admitting: Family Medicine

## 2020-10-19 DIAGNOSIS — Z1231 Encounter for screening mammogram for malignant neoplasm of breast: Secondary | ICD-10-CM

## 2020-10-24 ENCOUNTER — Other Ambulatory Visit
Admission: RE | Admit: 2020-10-24 | Discharge: 2020-10-24 | Disposition: A | Discharge status: Home / Self Care | Payer: Medicaid Other | Source: Ambulatory Visit | Attending: Gastroenterology | Admitting: Gastroenterology

## 2020-10-24 ENCOUNTER — Other Ambulatory Visit: Payer: Self-pay

## 2020-10-24 DIAGNOSIS — Z20822 Contact with and (suspected) exposure to covid-19: Secondary | ICD-10-CM | POA: Diagnosis not present

## 2020-10-24 DIAGNOSIS — Z01812 Encounter for preprocedural laboratory examination: Secondary | ICD-10-CM | POA: Diagnosis not present

## 2020-10-24 LAB — SARS CORONAVIRUS 2 (TAT 6-24 HRS): SARS Coronavirus 2: NEGATIVE

## 2020-10-26 ENCOUNTER — Ambulatory Visit
Admission: RE | Admit: 2020-10-26 | Discharge: 2020-10-26 | Disposition: A | Discharge status: Home / Self Care | Payer: Medicaid Other | Source: Ambulatory Visit | Attending: Gastroenterology | Admitting: Gastroenterology

## 2020-10-26 ENCOUNTER — Encounter
Admission: RE | Disposition: A | Discharge status: Home / Self Care | Payer: Self-pay | Source: Ambulatory Visit | Attending: Gastroenterology

## 2020-10-26 ENCOUNTER — Ambulatory Visit: Payer: Medicaid Other | Admitting: Certified Registered"

## 2020-10-26 ENCOUNTER — Encounter: Payer: Self-pay | Admitting: Gastroenterology

## 2020-10-26 DIAGNOSIS — Z800 Family history of malignant neoplasm of digestive organs: Secondary | ICD-10-CM | POA: Insufficient documentation

## 2020-10-26 DIAGNOSIS — Z1211 Encounter for screening for malignant neoplasm of colon: Secondary | ICD-10-CM

## 2020-10-26 DIAGNOSIS — Z803 Family history of malignant neoplasm of breast: Secondary | ICD-10-CM | POA: Diagnosis not present

## 2020-10-26 DIAGNOSIS — Z833 Family history of diabetes mellitus: Secondary | ICD-10-CM | POA: Diagnosis not present

## 2020-10-26 DIAGNOSIS — D124 Benign neoplasm of descending colon: Secondary | ICD-10-CM | POA: Insufficient documentation

## 2020-10-26 DIAGNOSIS — K635 Polyp of colon: Secondary | ICD-10-CM

## 2020-10-26 DIAGNOSIS — Z791 Long term (current) use of non-steroidal anti-inflammatories (NSAID): Secondary | ICD-10-CM | POA: Diagnosis not present

## 2020-10-26 DIAGNOSIS — F17210 Nicotine dependence, cigarettes, uncomplicated: Secondary | ICD-10-CM | POA: Diagnosis not present

## 2020-10-26 DIAGNOSIS — Z8371 Family history of colonic polyps: Secondary | ICD-10-CM | POA: Diagnosis not present

## 2020-10-26 DIAGNOSIS — Z79899 Other long term (current) drug therapy: Secondary | ICD-10-CM | POA: Diagnosis not present

## 2020-10-26 HISTORY — PX: COLONOSCOPY WITH PROPOFOL: SHX5780

## 2020-10-26 LAB — POCT PREGNANCY, URINE: Preg Test, Ur: NEGATIVE

## 2020-10-26 SURGERY — COLONOSCOPY WITH PROPOFOL
Anesthesia: General

## 2020-10-26 MED ORDER — PROPOFOL 10 MG/ML IV BOLUS
INTRAVENOUS | Status: DC | PRN
  Administered 2020-10-26: 10 mg via INTRAVENOUS
  Administered 2020-10-26: 20 mg via INTRAVENOUS
  Administered 2020-10-26: 50 mg via INTRAVENOUS

## 2020-10-26 MED ORDER — SODIUM CHLORIDE 0.9 % IV SOLN: INTRAVENOUS | Status: DC

## 2020-10-26 MED ORDER — PROPOFOL 500 MG/50ML IV EMUL
INTRAVENOUS | Status: DC | PRN
  Administered 2020-10-26: 165 ug/kg/min via INTRAVENOUS

## 2020-10-26 MED ORDER — LIDOCAINE HCL (CARDIAC) PF 100 MG/5ML IV SOSY
PREFILLED_SYRINGE | INTRAVENOUS | Status: DC | PRN
  Administered 2020-10-26: 100 mg via INTRAVENOUS

## 2020-10-26 MED ORDER — MIDAZOLAM HCL 2 MG/2ML IJ SOLN
INTRAMUSCULAR | Status: AC
  Filled 2020-10-26: qty 2

## 2020-10-26 NOTE — Anesthesia Postprocedure Evaluation (Signed)
Anesthesia Post Note  Patient: Carrie [name]  Procedure(s) Performed: COLONOSCOPY WITH PROPOFOL (N/A )  Patient location during evaluation: Endoscopy Anesthesia Type: General Level of consciousness: awake Pain management: pain level controlled Vital Signs Assessment: post-procedure vital signs reviewed and stable Cardiovascular status: stable and blood pressure returned to baseline Postop Assessment: no apparent nausea or vomiting Anesthetic complications: no   No complications documented.   Last Vitals:  Vitals:   10/26/20 1108 10/26/20 1118  BP: 109/80 129/80  Pulse: 65 78  Resp: 16 12  Temp:    SpO2: 100% 100%    Last Pain:  Vitals:   10/26/20 1118  TempSrc:   PainSc: 0-No pain                 Neva Seat

## 2020-10-26 NOTE — Anesthesia Procedure Notes (Signed)
Procedure Name: General with mask airway Performed by: Fletcher-Harrison, Keigan Tafoya, CRNA Pre-anesthesia Checklist: Patient identified, Emergency Drugs available, Suction available and Patient being monitored Patient Re-evaluated:Patient Re-evaluated prior to induction Oxygen Delivery Method: Simple face mask Induction Type: IV induction Placement Confirmation: positive ETCO2 and CO2 detector Dental Injury: Teeth and Oropharynx as per pre-operative assessment        

## 2020-10-26 NOTE — Op Note (Signed)
[facility] Gastroenterology Patient Name: Carrie [name] Procedure Date: 10/26/2020 10:18 AM MRN: [id number] Account #: [id number] Date of Birth: [birth year] Admit Type: Outpatient Age: 44 Room: [facility] ENDO ROOM 3 Gender: Female Note Status: Finalized Procedure:             Colonoscopy Indications:           Screening for colorectal malignant neoplasm Providers:             Jonathon Bellows MD, MD Referring MD:          [place], MD (Referring MD) Medicines:             Monitored Anesthesia Care Complications:         No immediate complications. Procedure:             Pre-Anesthesia Assessment:                        - Prior to the procedure, a History and Physical was                         performed, and patient medications, allergies and                         sensitivities were reviewed. The patient's tolerance                         of previous anesthesia was reviewed.                        - The risks and benefits of the procedure and the                         sedation options and risks were discussed with the                         patient. All questions were answered and informed                         consent was obtained.                        - ASA Grade Assessment: II - A patient with mild                         systemic disease.                        After obtaining informed consent, the colonoscope was                         passed under direct vision. Throughout the procedure,                         the patient's blood pressure, pulse, and oxygen                         saturations were monitored continuously. The                         Colonoscope was introduced through  the anus and                         advanced to the the cecum, identified by the                         appendiceal orifice. The colonoscopy was performed                         with ease. The patient tolerated the procedure well.                          The quality of the bowel preparation was adequate. Findings:      The perianal and digital rectal examinations were normal.      A 5 mm polyp was found in the descending colon. The polyp was sessile.       The polyp was removed with a cold snare. Resection and retrieval were       complete.      The exam was otherwise without abnormality on direct and retroflexion       views. Impression:            - One 5 mm polyp in the descending colon, removed with                         a cold snare. Resected and retrieved.                        - The examination was otherwise normal on direct and                         retroflexion views. Recommendation:        - Discharge patient to home (with escort).                        - Resume previous diet.                        - Continue present medications.                        - Await pathology results.                        - Repeat colonoscopy for surveillance based on                         pathology results. Procedure Code(s):     --- Professional ---                        [phone], Colonoscopy, flexible; with removal of                         tumor(s), polyp(s), or other lesion(s) by snare                         technique Diagnosis Code(s):     --- Professional ---                        Z12.11,  Encounter for screening for malignant neoplasm                         of colon                        K63.5, Polyp of colon CPT copyright 2019 American Medical Association. All rights reserved. The codes documented in this report are preliminary and upon coder review may  be revised to meet current compliance requirements. Jonathon Bellows, MD Jonathon Bellows MD, MD 10/26/2020 10:56:06 AM This report has been signed electronically. Number of Addenda: 0 Note Initiated On: 10/26/2020 10:18 AM Scope Withdrawal Time: 0 hours 13 minutes 57 seconds  Total Procedure Duration: 0 hours 17 minutes 42 seconds  Estimated Blood Loss:  Estimated blood  loss: none.      [facility]

## 2020-10-26 NOTE — H&P (Signed)
Jonathon Bellows, MD [address] [place], [place], [place], [address] [address], [place], [place], [place], [address] Phone: [phone]  Fax: [phone]  Primary Care Physician:  [facility] [place]   Pre-Procedure History & Physical: HPI:  Carrie [name] is a 44 y.o. female is here for an colonoscopy.   Past Medical History:  Diagnosis Date  . Bartholin cyst   . Muscle spasm     Past Surgical History:  Procedure Laterality Date  . CESAREAN SECTION    . TUBAL LIGATION      Prior to Admission medications   Medication Sig Start Date End Date Taking? Authorizing Provider  albuterol (PROVENTIL HFA;VENTOLIN HFA) 108 (90 Base) MCG/ACT inhaler Inhale 2 puffs into the lungs every 6 (six) hours as needed for wheezing or shortness of breath. Patient not taking: Reported on 10/12/2020 09/01/18   Sable Feil, PA-C  fexofenadine-pseudoephedrine (ALLEGRA-D) 60-120 MG 12 hr tablet Take 1 tablet by mouth 2 (two) times daily. 09/01/18   Sable Feil, PA-C  ibuprofen (ADVIL,MOTRIN) 600 MG tablet Take 1 tablet (600 mg total) by mouth every 8 (eight) hours as needed. 09/01/18   Sable Feil, PA-C  sertraline (ZOLOFT) 50 MG tablet Take 1 tablet (50 mg total) by mouth at bedtime. Take 1/2 tab for 7 days then daily. Patient not taking: Reported on 06/03/2017 11/14/15   Defrancesco, Alanda Slim, MD  zolpidem (AMBIEN CR) 6.25 MG CR tablet Take by mouth.    [provider]    Allergies as of 10/12/2020  . (No Known Allergies)    Family History  Problem Relation Age of Onset  . Diabetes Maternal Grandmother   . Breast cancer Maternal Aunt   . Breast cancer Maternal Aunt   . Ovarian cancer Neg Hx   . Heart disease Neg Hx     Social History   Socioeconomic History  . Marital status: Single    Spouse name: Not on file  . Number of children: Not on file  . Years of education: Not on file  . Highest education level: Not on file  Occupational History  .  Not on file  Tobacco Use  . Smoking status: Current Every Day Smoker    Packs/day: 0.25    Types: Cigarettes  . Smokeless tobacco: Never Used  Vaping Use  . Vaping Use: Never used  Substance and Sexual Activity  . Alcohol use: No  . Drug use: No  . Sexual activity: Yes    Birth control/protection: Surgical  Other Topics Concern  . Not on file  Social History Narrative  . Not on file   Social Determinants of Health   Financial Resource Strain:   . Difficulty of Paying Living Expenses: Not on file  Food Insecurity:   . Worried About Charity fundraiser in the Last Year: Not on file  . Ran Out of Food in the Last Year: Not on file  Transportation Needs:   . Lack of Transportation (Medical): Not on file  . Lack of Transportation (Non-Medical): Not on file  Physical Activity:   . Days of Exercise per Week: Not on file  . Minutes of Exercise per Session: Not on file  Stress:   . Feeling of Stress : Not on file  Social Connections:   . Frequency of Communication with Friends and Family: Not on file  . Frequency of Social Gatherings with Friends and Family: Not on file  . Attends Religious Services:  Not on file  . Active Member of Clubs or Organizations: Not on file  . Attends Archivist Meetings: Not on file  . Marital Status: Not on file  Intimate Partner Violence:   . Fear of Current or Ex-Partner: Not on file  . Emotionally Abused: Not on file  . Physically Abused: Not on file  . Sexually Abused: Not on file    Review of Systems: See HPI, otherwise negative ROS  Physical Exam: BP 139/73   Pulse 67   Temp 97.9 F (36.6 C) (Tympanic)   Resp 20   Ht 5' (1.524 m)   Wt 53.1 kg   SpO2 100%   BMI 22.85 kg/m  General:   Alert,  pleasant and cooperative in NAD Head:  Normocephalic and atraumatic. Neck:  Supple; no masses or thyromegaly. Lungs:  Clear throughout to auscultation, normal respiratory effort.    Heart:  +S1, +S2, Regular rate and rhythm, No  edema. Abdomen:  Soft, nontender and nondistended. Normal bowel sounds, without guarding, and without rebound.   Neurologic:  Alert and  oriented x4;  grossly normal neurologically.  Impression/Plan: Carrie [name] is here for an colonoscopy to be performed for Screening colonoscopy average risk   Risks, benefits, limitations, and alternatives regarding  colonoscopy have been reviewed with the patient.  Questions have been answered.  All parties agreeable.   Jonathon Bellows, MD  10/26/2020, 10:20 AM

## 2020-10-26 NOTE — Transfer of Care (Signed)
Immediate Anesthesia Transfer of Care Note  Patient: Carrie [name]  Procedure(s) Performed: COLONOSCOPY WITH PROPOFOL (N/A )  Patient Location: Endoscopy Unit  Anesthesia Type:General  Level of Consciousness: drowsy and patient cooperative  Airway & Oxygen Therapy: Patient Spontanous Breathing  Post-op Assessment: Report given to RN and Post -op Vital signs reviewed and stable  Post vital signs: Reviewed and stable  Last Vitals:  Vitals Value Taken Time  BP 129/80 10/26/20 1119  Temp 36.6 C 10/26/20 1058  Pulse 65 10/26/20 1118  Resp 16 10/26/20 1118  SpO2 100 % 10/26/20 1118  Vitals shown include unvalidated device data.  Last Pain:  Vitals:   10/26/20 1118  TempSrc:   PainSc: 0-No pain         Complications: No complications documented.

## 2020-10-26 NOTE — Anesthesia Preprocedure Evaluation (Signed)
Anesthesia Evaluation  Patient identified by MRN, date of birth, ID band Patient awake    Reviewed: Allergy & Precautions, NPO status , Patient's Chart, lab work & pertinent test results  Airway Mallampati: II       Dental no notable dental hx.    Pulmonary neg pulmonary ROS, Current Smoker and Patient abstained from smoking.,    Pulmonary exam normal breath sounds clear to auscultation       Cardiovascular negative cardio ROS Normal cardiovascular exam Rhythm:Regular Rate:Normal     Neuro/Psych PSYCHIATRIC DISORDERS Anxiety Depression negative neurological ROS     GI/Hepatic negative GI ROS, Neg liver ROS,   Endo/Other  negative endocrine ROS  Renal/GU negative Renal ROS  negative genitourinary   Musculoskeletal negative musculoskeletal ROS (+)   Abdominal   Peds negative pediatric ROS (+)  Hematology negative hematology ROS (+)   Anesthesia Other Findings .Marland KitchenPast Medical History: No date: Bartholin cyst No date: Muscle spasm   Reproductive/Obstetrics negative OB ROS                             Anesthesia Physical Anesthesia Plan  ASA: II  Anesthesia Plan: General   Post-op Pain Management:    Induction: Intravenous  PONV Risk Score and Plan: 2 and Propofol infusion  Airway Management Planned: Nasal Cannula  Additional Equipment: None  Intra-op Plan:   Post-operative Plan:   Informed Consent: I have reviewed the patients History and Physical, chart, labs and discussed the procedure including the risks, benefits and alternatives for the proposed anesthesia with the patient or authorized representative who has indicated his/her understanding and acceptance.       Plan Discussed with: CRNA, Anesthesiologist and Surgeon  Anesthesia Plan Comments:         Anesthesia Quick Evaluation

## 2020-10-27 ENCOUNTER — Encounter: Payer: Self-pay | Admitting: Gastroenterology

## 2020-10-27 LAB — SURGICAL PATHOLOGY

## 2020-11-24 ENCOUNTER — Ambulatory Visit
Admission: RE | Admit: 2020-11-24 | Discharge: 2020-11-24 | Disposition: A | Discharge status: Home / Self Care | Payer: Medicaid Other | Source: Ambulatory Visit | Attending: Family Medicine | Admitting: Family Medicine

## 2020-11-24 ENCOUNTER — Other Ambulatory Visit: Payer: Self-pay

## 2020-11-24 DIAGNOSIS — Z1231 Encounter for screening mammogram for malignant neoplasm of breast: Secondary | ICD-10-CM

## 2022-01-26 ENCOUNTER — Ambulatory Visit: Payer: Self-pay

## 2022-09-17 ENCOUNTER — Other Ambulatory Visit: Payer: Self-pay | Admitting: Obstetrics and Gynecology

## 2022-09-17 DIAGNOSIS — N939 Abnormal uterine and vaginal bleeding, unspecified: Secondary | ICD-10-CM

## 2022-09-23 ENCOUNTER — Encounter: Payer: Self-pay | Admitting: Obstetrics and Gynecology

## 2022-09-23 ENCOUNTER — Other Ambulatory Visit: Payer: Self-pay

## 2022-09-25 ENCOUNTER — Encounter: Payer: Self-pay | Admitting: Obstetrics and Gynecology

## 2022-09-27 ENCOUNTER — Other Ambulatory Visit: Payer: Self-pay

## 2022-12-22 ENCOUNTER — Emergency Department: Payer: 59

## 2022-12-22 ENCOUNTER — Other Ambulatory Visit: Payer: Self-pay

## 2022-12-22 ENCOUNTER — Emergency Department
Admission: EM | Admit: 2022-12-22 | Discharge: 2022-12-22 | Disposition: A | Discharge status: Home / Self Care | Payer: 59 | Attending: Emergency Medicine | Admitting: Emergency Medicine

## 2022-12-22 DIAGNOSIS — M25511 Pain in right shoulder: Secondary | ICD-10-CM | POA: Diagnosis present

## 2022-12-22 DIAGNOSIS — R079 Chest pain, unspecified: Secondary | ICD-10-CM | POA: Diagnosis not present

## 2022-12-22 LAB — CBC WITH DIFFERENTIAL/PLATELET
Abs Immature Granulocytes: 0.05 10*3/uL (ref 0.00–0.07)
Basophils Absolute: 0.2 10*3/uL — ABNORMAL HIGH (ref 0.0–0.1)
Basophils Relative: 1 %
Eosinophils Absolute: 0.3 10*3/uL (ref 0.0–0.5)
Eosinophils Relative: 2 %
HCT: 34.9 % — ABNORMAL LOW (ref 36.0–46.0)
Hemoglobin: 10.9 g/dL — ABNORMAL LOW (ref 12.0–15.0)
Immature Granulocytes: 0 %
Lymphocytes Relative: 26 %
Lymphs Abs: 3.1 10*3/uL (ref 0.7–4.0)
MCH: 23.8 pg — ABNORMAL LOW (ref 26.0–34.0)
MCHC: 31.2 g/dL (ref 30.0–36.0)
MCV: 76.2 fL — ABNORMAL LOW (ref 80.0–100.0)
Monocytes Absolute: 1 10*3/uL (ref 0.1–1.0)
Monocytes Relative: 8 %
Neutro Abs: 7.3 10*3/uL (ref 1.7–7.7)
Neutrophils Relative %: 63 %
Platelets: 522 10*3/uL — ABNORMAL HIGH (ref 150–400)
RBC: 4.58 MIL/uL (ref 3.87–5.11)
RDW: 18.7 % — ABNORMAL HIGH (ref 11.5–15.5)
WBC: 11.8 10*3/uL — ABNORMAL HIGH (ref 4.0–10.5)
nRBC: 0 % (ref 0.0–0.2)

## 2022-12-22 LAB — COMPREHENSIVE METABOLIC PANEL
ALT: 13 U/L (ref 0–44)
AST: 20 U/L (ref 15–41)
Albumin: 4.2 g/dL (ref 3.5–5.0)
Alkaline Phosphatase: 92 U/L (ref 38–126)
Anion gap: 10 (ref 5–15)
BUN: 10 mg/dL (ref 6–20)
CO2: 23 mmol/L (ref 22–32)
Calcium: 9.5 mg/dL (ref 8.9–10.3)
Chloride: 103 mmol/L (ref 98–111)
Creatinine, Ser: 0.77 mg/dL (ref 0.44–1.00)
GFR, Estimated: >60 mL/min (ref >60)
Glucose, Bld: 103 mg/dL — ABNORMAL HIGH (ref 70–99)
Potassium: 4 mmol/L (ref 3.5–5.1)
Sodium: 136 mmol/L (ref 135–145)
Total Bilirubin: 0.7 mg/dL (ref 0.3–1.2)
Total Protein: 8.1 g/dL (ref 6.5–8.1)

## 2022-12-22 LAB — URINALYSIS, ROUTINE W REFLEX MICROSCOPIC
Bilirubin Urine: NEGATIVE
Glucose, UA: NEGATIVE mg/dL
Hgb urine dipstick: NEGATIVE
Ketones, ur: NEGATIVE mg/dL
Leukocytes,Ua: NEGATIVE
Nitrite: NEGATIVE
Protein, ur: NEGATIVE mg/dL
Specific Gravity, Urine: 1.011 (ref 1.005–1.030)
pH: 7 (ref 5.0–8.0)

## 2022-12-22 LAB — LIPASE, BLOOD: Lipase: 36 U/L (ref 11–51)

## 2022-12-22 LAB — TROPONIN I (HIGH SENSITIVITY): Troponin I (High Sensitivity): <2 ng/L (ref <18)

## 2022-12-22 MED ORDER — PANTOPRAZOLE SODIUM 20 MG PO TBEC: 20.0000 mg | DELAYED_RELEASE_TABLET | Freq: Every day | ORAL | 0 refills | Status: AC

## 2022-12-22 MED ORDER — LIDOCAINE VISCOUS HCL 2 % MT SOLN
15.0000 mL | Freq: Once | OROMUCOSAL | Status: AC
  Administered 2022-12-22: 15 mL via OROMUCOSAL
  Filled 2022-12-22: qty 15

## 2022-12-22 MED ORDER — ALUM & MAG HYDROXIDE-SIMETH 200-200-20 MG/5ML PO SUSP
30.0000 mL | Freq: Once | ORAL | Status: AC
  Administered 2022-12-22: 30 mL via ORAL
  Filled 2022-12-22: qty 30

## 2022-12-22 MED ORDER — SUCRALFATE 1 G PO TABS: 1.0000 g | ORAL_TABLET | Freq: Four times a day (QID) | ORAL | 0 refills | Status: DC

## 2022-12-22 NOTE — Discharge Instructions (Addendum)
Please stop taking any NSAIDs (Motrin, ibuprofen, naproxen, Naprosyn) and please use Tylenol for any continued pain control.

## 2022-12-22 NOTE — ED Provider Triage Note (Signed)
Emergency Medicine Provider Triage Evaluation Note  Carrie [name] , a 47 y.o. female  was evaluated in triage.  Pt complains of right sided cp, some heartburn, concerned bc is in the middle of her chest.  Review of Systems  Positive:  Negative:   Physical Exam  Ht 5' (1.524 m)   Wt 53 kg   LMP 12/06/2022 (Approximate)   BMI 22.82 kg/m  Gen:   Awake, no distress   Resp:  Normal effort  MSK:   Moves extremities without difficulty  Other:  Ruq tender  Medical Decision Making  Medically screening exam initiated at 1:09 PM.  Appropriate orders placed.  Veanna L Nilsen was informed that the remainder of the evaluation will be completed by another provider, this initial triage assessment does not replace that evaluation, and the importance of remaining in the ED until their evaluation is complete.     Versie Starks, PA-C 12/22/22 1310

## 2022-12-22 NOTE — ED Triage Notes (Signed)
Pt reports cp that started Friday and is burning in nature and radiates into her right shoulder. Pt reports pain is constant and she is feeling nauseated and SOB as well. Pt reports has been taking tums with no relief

## 2022-12-22 NOTE — ED Provider Notes (Signed)
[facility] Provider Note   Event Date/Time   First MD Initiated Contact with Patient 12/22/22 1408     (approximate) History  Chest Pain and Shortness of Breath  HPI Carrie [name] is a 47 y.o. female with a stated past medical history of H. pylori status posttreatment who presents for burning midepigastric and lower chest pain that she states radiates around to her back as well as which she states is associated right shoulder burning pain that radiates from the right aspect of her neck down to the elbow occasionally.  Patient states that because of this pain in her neck and shoulder she has been taking Aleve almost every day for the last few weeks.  Patient denies any dark tarry stools or bright red blood per rectum.  Patient endorses associated shortness of breath. ROS: Patient currently denies any vision changes, tinnitus, difficulty speaking, facial droop, sore throat, abdominal pain, nausea/vomiting/diarrhea, dysuria, or weakness/numbness/paresthesias in any extremity   Physical Exam  Triage Vital Signs: ED Triage Vitals  Enc Vitals Group     BP 12/22/22 1313 (!) 159/101     Pulse Rate 12/22/22 1313 77     Resp 12/22/22 1313 16     Temp 12/22/22 1313 98.7 F (37.1 C)     Temp src --      SpO2 12/22/22 1313 97 %     Weight 12/22/22 1248 116 lb 13.5 oz (53 kg)     Height 12/22/22 1248 5' (1.524 m)     Head Circumference --      Peak Flow --      Pain Score 12/22/22 1248 10     Pain Loc --      Pain Edu? --      Excl. in [place]? --    Most recent vital signs: Vitals:   12/22/22 1313  BP: (!) 159/101  Pulse: 77  Resp: 16  Temp: 98.7 F (37.1 C)  SpO2: 97%   General: Awake, oriented x4. CV:  Good peripheral perfusion.  Resp:  Normal effort.  Abd:  No distention.  Other:  Middle-aged African-American female laying in bed in no acute distress. ED Results / Procedures / Treatments  Labs (all labs ordered are listed, but only abnormal results are  displayed) Labs Reviewed  COMPREHENSIVE METABOLIC PANEL - Abnormal; Notable for the following components:      Result Value   Glucose, Bld 103 (*)    All other components within normal limits  CBC WITH DIFFERENTIAL/PLATELET - Abnormal; Notable for the following components:   WBC 11.8 (*)    Hemoglobin 10.9 (*)    HCT 34.9 (*)    MCV 76.2 (*)    MCH 23.8 (*)    RDW 18.7 (*)    Platelets 522 (*)    Basophils Absolute 0.2 (*)    All other components within normal limits  LIPASE, BLOOD  URINALYSIS, ROUTINE W REFLEX MICROSCOPIC  TROPONIN I (HIGH SENSITIVITY)  TROPONIN I (HIGH SENSITIVITY)   EKG ED ECG REPORT I, Naaman Plummer, the attending physician, personally viewed and interpreted this ECG. Date: 12/22/2022 EKG Time: 1309 Rate: 73 Rhythm: normal sinus rhythm QRS Axis: normal Intervals: normal ST/T Wave abnormalities: normal Narrative Interpretation: no evidence of acute ischemia RADIOLOGY ED MD interpretation: Ultrasound of the right upper quadrant interpreted independently by me and shows no acute or significant right upper quadrant abdominal findings  2 view chest x-ray interpreted by me shows no evidence of acute abnormalities  including no pneumonia, pneumothorax, or widened mediastinum -Agree with radiology assessment Official radiology report(s): US Abdomen Limited RUQ (LIVER/GB)  Result Date: 12/22/2022 CLINICAL DATA:  Right upper quadrant abdominal pain for 2 days. EXAM: ULTRASOUND ABDOMEN LIMITED RIGHT UPPER QUADRANT COMPARISON:  None Available. FINDINGS: Gallbladder: No gallstones or wall thickening visualized. No sonographic Murphy sign noted by sonographer. Common bile duct: Diameter: 5 mm.  No intrahepatic biliary dilatation. Liver: Probable small cyst in the left lobe measuring 7 mm maximally. No suspicious liver lesions. Portal vein is patent on color Doppler imaging with normal direction of blood flow towards the liver. Other: None. IMPRESSION: No acute or  significant right upper quadrant abdominal findings. Electronically Signed   By: Richardean Sale M.D.   On: 12/22/2022 14:17   DG Chest 2 View  Result Date: 12/22/2022 CLINICAL DATA:  Chest pain EXAM: CHEST - 2 VIEW COMPARISON:  09/01/2018 FINDINGS: The heart size and mediastinal contours are within normal limits. Both lungs are clear. The visualized skeletal structures are unremarkable. IMPRESSION: No acute abnormality of the lungs. Electronically Signed   By: Delanna Ahmadi M.D.   On: 12/22/2022 13:50   PROCEDURES: Critical Care performed: No .1-3 Lead EKG Interpretation  Performed by: Naaman Plummer, MD Authorized by: Naaman Plummer, MD     Interpretation: normal     ECG rate:  71   ECG rate assessment: normal     Rhythm: sinus rhythm     Ectopy: none     Conduction: normal    MEDICATIONS ORDERED IN ED: Medications  alum & mag hydroxide-simeth (MAALOX/MYLANTA) 200-200-20 MG/5ML suspension 30 mL (has no administration in time range)  lidocaine (XYLOCAINE) 2 % viscous mouth solution 15 mL (has no administration in time range)   IMPRESSION / MDM / ASSESSMENT AND PLAN / ED COURSE  I reviewed the triage vital signs and the nursing notes.                              Patient's presentation is most consistent with acute presentation with potential threat to life or bodily function. 47 year old female with the above-stated past medical history who presents for burning lower substernal and midepigastric chest pain as well as her right shoulder and neck pain Workup: ECG, CXR, CBC, BMP, Troponin Findings: ECG: No overt evidence of STEMI. No evidence of Brugadas sign, delta wave, epsilon wave, significantly prolonged QTc, or malignant arrhythmia HS Troponin: Negative x1 Other Labs unremarkable for emergent problems. CXR: Without PTX, PNA, or widened mediastinum Last Stress Test: Never Last Heart Catheterization: Never HEART Score: 3  Given History, Exam, and Workup I have low  suspicion for ACS, Pneumothorax, Pneumonia, Pulmonary Embolus, Tamponade, Aortic Dissection or other emergent problem as a cause for this presentation.   Reassesment: Prior to discharge patients pain was controlled and they were well appearing.  Disposition:  Discharge. Strict return precautions discussed with patient with full understanding. Advised patient to follow up promptly with primary care provider    FINAL CLINICAL IMPRESSION(S) / ED DIAGNOSES   Final diagnoses:  Chest pain, unspecified type  Acute pain of right shoulder   Rx / DC Orders   ED Discharge Orders          Ordered    sucralfate (CARAFATE) 1 g tablet  4 times daily        12/22/22 1545    pantoprazole (PROTONIX) 20 MG tablet  Daily at bedtime  12/22/22 1545           Note:  This document was prepared using Dragon voice recognition software and may include unintentional dictation errors.   Naaman Plummer, MD 12/22/22 1550

## 2024-02-24 ENCOUNTER — Encounter: Payer: Self-pay | Admitting: Physician Assistant

## 2024-02-24 DIAGNOSIS — Z1231 Encounter for screening mammogram for malignant neoplasm of breast: Secondary | ICD-10-CM

## 2024-03-02 ENCOUNTER — Other Ambulatory Visit: Payer: Self-pay | Admitting: Physician Assistant

## 2024-03-02 DIAGNOSIS — Z803 Family history of malignant neoplasm of breast: Secondary | ICD-10-CM

## 2024-03-02 DIAGNOSIS — Z1231 Encounter for screening mammogram for malignant neoplasm of breast: Secondary | ICD-10-CM

## 2024-03-02 DIAGNOSIS — N644 Mastodynia: Secondary | ICD-10-CM

## 2024-03-24 ENCOUNTER — Ambulatory Visit
Admission: RE | Admit: 2024-03-24 | Discharge: 2024-03-24 | Disposition: A | Discharge status: Home / Self Care | Source: Ambulatory Visit | Attending: Physician Assistant | Admitting: Physician Assistant

## 2024-03-24 DIAGNOSIS — Z803 Family history of malignant neoplasm of breast: Secondary | ICD-10-CM

## 2024-03-24 DIAGNOSIS — Z1231 Encounter for screening mammogram for malignant neoplasm of breast: Secondary | ICD-10-CM | POA: Insufficient documentation

## 2024-03-24 DIAGNOSIS — N644 Mastodynia: Secondary | ICD-10-CM | POA: Diagnosis present

## 2024-08-20 ENCOUNTER — Ambulatory Visit

## 2024-08-20 VITALS — BP 126/68 | HR 71 | Ht 60.0 in | Wt 135.0 lb

## 2024-08-20 DIAGNOSIS — G4709 Other insomnia: Secondary | ICD-10-CM

## 2024-08-20 DIAGNOSIS — N951 Menopausal and female climacteric states: Secondary | ICD-10-CM

## 2024-08-20 DIAGNOSIS — I10 Essential (primary) hypertension: Secondary | ICD-10-CM

## 2024-08-20 DIAGNOSIS — K219 Gastro-esophageal reflux disease without esophagitis: Secondary | ICD-10-CM

## 2024-08-20 DIAGNOSIS — Z862 Personal history of diseases of the blood and blood-forming organs and certain disorders involving the immune mechanism: Secondary | ICD-10-CM

## 2024-08-20 MED ORDER — TRAZODONE HCL 50 MG PO TABS: 25.0000 mg | ORAL_TABLET | Freq: Every evening | ORAL | 3 refills | Status: AC | PRN

## 2024-08-20 MED ORDER — ESTRADIOL 0.025 MG/24HR TD PTWK: 0.0250 mg | MEDICATED_PATCH | TRANSDERMAL | 1 refills | Status: AC

## 2024-08-20 MED ORDER — PROGESTERONE MICRONIZED 100 MG PO CAPS: 100.0000 mg | ORAL_CAPSULE | Freq: Every day | ORAL | 1 refills | Status: AC

## 2024-08-20 NOTE — Progress Notes (Signed)
 New Patient Visit   Physician: Olsen Mccutchan A Miranda Garber, MD  Patient: Carrie [name]   DOB: Sep 27, 1976   48 y.o. Female  MRN: [id number] Visit Date: 08/20/2024   Chief Complaint  Patient presents with   Establish Care   Subjective  Carrie [name] is a 48 y.o. female who presents today as a new patient to establish care.   HPI  Discussed the use of AI scribe software for clinical note transcription with the patient, who gave verbal consent to proceed.  History of Present Illness   Carrie [name] is a 48 year old female who presents for management of perimenopausal symptoms.  Perimenopausal symptoms for the last year - Severe night sweats, mood swings, hot flashes, brain fog, and significant fatigue - Menstrual periods are irregular, lasting four to nine days - Muscle aches - Vaginal estrogen cream provides partial relief of symptoms for atrophic vaginitis  - Sleep disturbance as below  Sleep disturbance - Ambien used for sleep issues for approximately one year - Persistent daily exhaustion since starting medication  GERD  - Currently taking pantoprazole  20 mg daily.   - Symptoms well controlled with this medication  Hypertension  - Takes amlodipine 5mg   - BP in office 126/68  - No FH stroke or CAD  History iron def anemia   - Takes iron supplement which is causing some GI effects  - Due for repeat CBC  ++ FH DM and breast cancer  Lifestyle modifications - Quit smoking in March after 30 years of tobacco use - No alcohol or drug use - Increased water intake - Walks three days a week with sister-in-law        ASSESSMENT & PLAN  Encounter Diagnoses  Name Primary?   Other insomnia Yes   Hypertension, unspecified type    Vasomotor symptoms due to menopause    Gastroesophageal reflux disease without esophagitis    History of anemia     Orders Placed This Encounter  Procedures   CBC with Differential/Platelet   Comprehensive metabolic panel with GFR    Hemoglobin A1c   Lipid panel   Urinalysis, Routine w reflex microscopic    Assessment and Plan    Perimenopausal symptoms with insomnia, mood disturbance, and vasomotor symptoms Perimenopausal symptoms include night sweats, mood swings, hot flashes, brain fog, and fatigue. Current management with Ambien for sleep and sertraline  for mood. Hormone replacement therapy (HRT) is preferred for stabilizing mood, improving sleep, and addressing vasomotor symptoms. Risks of HRT, such as endometrial cancer, are minimal when estrogen is given with progesterone .  No medical history contraindications  - Discontinue Premarin vaginal cream. - Prescribe transdermal estrogen patch and oral progesterone . - Discontinue Ambien and prescribe a replacement sleep aid. - Wean off sertraline  after starting HRT. - Follow up in 4 weeks to assess response to HRT and adjust treatment as needed.  Essential hypertension Currently managed with medication. Continue amlodipine 5 mg  Gastroesophageal reflux disease (GERD) Managed with pantoprazole  20 mg. Potential side effects include interference with calcium magnesium absorption and increased risk of respiratory infections.  Iron deficiency anemia Previous blood work indicated anemia. Regular monitoring is important, especially with family history of chronic kidney disease. - Order blood work to assess current status of anemia.  History of tobacco use Quit smoking in March after 30 years. Benefits of quitting discussed. Potential for a screening CT for lung health given long history of smoking. - Consider low-dose screening CT for lung health if insurance criteria  are met.     F/u in 3 weeks with labs    Objective  BP 126/68   Pulse 71   Ht 5' (1.524 m)   Wt 135 lb (61.2 kg)   LMP 08/09/2024 (Exact Date)   SpO2 98%   BMI 26.37 kg/m      Review of Systems  Constitutional:  Negative for chills, fever and weight loss.  Eyes:  Negative for blurred vision.  h Respiratory:  Negative for cough and shortness of breath.   Cardiovascular:  Negative for chest pain and palpitations.  Skin:  Negative for rash.  Psychiatric/Behavioral:  Negative for depression. The patient is not nervous/anxious.      Physical Exam Physical Exam Vitals reviewed.  Constitutional:      Appearance: Normal appearance. Well-developed with normal weight.  HENT:     Head: Normocephalic and atraumatic.  Normal mucous membranes, no oral lesions Eyes:     Pupils: Pupils are equal, round, and reactive to light.  Neck:     Thyroid: No thyroid mass or thyromegaly.  Cardiovascular:     Rate and Rhythm: Normal rate and regular rhythm. Normal heart sounds. Normal peripheral pulses Pulmonary:     Normal breath sounds with normal effort Abdominal:   Abdomen is soft, without tenderness or noted hepatosplenomegaly Musculoskeletal:        General: No swelling or edema  Lymphadenopathy:     Cervical: No cervical adenopathy.  Skin:    General: Skin is warm and dry without noticeable rash. Neurological:     General: No focal deficit present.  Psychiatric:        Mood and Affect: Mood, behavior and cognition normal   Past Medical History:  Diagnosis Date   Anemia    Anxiety    Bartholin cyst    Depression    1 1/2 years   GERD (gastroesophageal reflux disease)    Hypertension    Muscle spasm    Vulvar cyst 10/24/2015   Left labia majora     Past Surgical History:  Procedure Laterality Date   CESAREAN SECTION     COLONOSCOPY WITH PROPOFOL  N/A 10/26/2020   Procedure: COLONOSCOPY WITH PROPOFOL ;  Surgeon: Therisa Bi, MD;  Location: [facility] ENDOSCOPY;  Service: Gastroenterology;  Laterality: N/A;   TUBAL LIGATION     Family Status  Relation Name Status   MGM Dorothey Medical laboratory scientific officer (Not Specified)   Mat Aunt  (Not Specified)   Mat Aunt  (Not Specified)   Mother Dorothe Searles   Father Franky Alive   Brother Lani Alive   Sister Tonia Alive   Neg Hx  (Not Specified)  No  partnership data on file   Family History  Problem Relation Age of Onset   Diabetes Maternal Grandmother    Breast cancer Maternal Aunt    Breast cancer Maternal Aunt    Alcohol abuse Mother    Cancer Mother    Early death Mother    Early death Father    Alcohol abuse Brother    Kidney disease Sister    Ovarian cancer Neg Hx    Heart disease Neg Hx    Social History   Socioeconomic History   Marital status: Single    Spouse name: Not on file   Number of children: Not on file   Years of education: Not on file   Highest education level: Bachelor's degree (e.g., BA, AB, BS)  Occupational History   Not on file  Tobacco Use   Smoking status:  Every Day    Current packs/day: 0.00    Average packs/day: 0.5 packs/day for 30.0 years (15.0 ttl pk-yrs)    Types: Cigarettes    Last attempt to quit: 02/14/2024    Years since quitting: 0.5   Smokeless tobacco: Never  Vaping Use   Vaping status: Never Used  Substance and Sexual Activity   Alcohol use: No   Drug use: No   Sexual activity: Yes    Birth control/protection: Surgical  Other Topics Concern   Not on file  Social History Narrative   Not on file   Social Drivers of Health   Financial Resource Strain: Low Risk  (08/20/2024)   Overall Financial Resource Strain (CARDIA)    Difficulty of Paying Living Expenses: Not hard at all  Food Insecurity: No Food Insecurity (08/20/2024)   Hunger Vital Sign    Worried About Running Out of Food in the Last Year: Never true    Ran Out of Food in the Last Year: Never true  Transportation Needs: No Transportation Needs (08/20/2024)   PRAPARE - Administrator, Civil Service (Medical): No    Lack of Transportation (Non-Medical): No  Physical Activity: Sufficiently Active (08/20/2024)   Exercise Vital Sign    Days of Exercise per Week: 3 days    Minutes of Exercise per Session: 60 min  Stress: Stress Concern Present (08/20/2024)   [employer] of Occupational Health -  Occupational Stress Questionnaire    Feeling of Stress: To some extent  Social Connections: Socially Integrated (08/20/2024)   Social Connection and Isolation Panel    Frequency of Communication with Friends and Family: More than three times a week    Frequency of Social Gatherings with Friends and Family: Twice a week    Attends Religious Services: More than 4 times per year    Active Member of [employer] or Organizations: Yes    Attends Engineer, structural: More than 4 times per year    Marital Status: Married   Outpatient Medications Prior to Visit  Medication Sig   albuterol  (PROVENTIL  HFA;VENTOLIN  HFA) 108 (90 Base) MCG/ACT inhaler Inhale 2 puffs into the lungs every 6 (six) hours as needed for wheezing or shortness of breath.   amLODipine (NORVASC) 5 MG tablet Take 5 mg by mouth daily.   FEROSUL 325 (65 Fe) MG tablet Take 325 mg by mouth daily.   fexofenadine -pseudoephedrine (ALLEGRA-D) 60-120 MG 12 hr tablet Take 1 tablet by mouth 2 (two) times daily.   ibuprofen  (ADVIL ,MOTRIN ) 600 MG tablet Take 1 tablet (600 mg total) by mouth every 8 (eight) hours as needed.   pantoprazole  (PROTONIX ) 20 MG tablet Take 1 tablet (20 mg total) by mouth at bedtime.   sertraline  (ZOLOFT ) 50 MG tablet Take 1 tablet (50 mg total) by mouth at bedtime. Take 1/2 tab for 7 days then daily.   sucralfate  (CARAFATE ) 1 g tablet Take 1 tablet (1 g total) by mouth 4 (four) times daily.   Vitamin D, Ergocalciferol, (DRISDOL) 1.25 MG (50000 UNIT) CAPS capsule Take 50,000 Units by mouth once a week.   [DISCONTINUED] estradiol  (ESTRACE ) 0.1 MG/GM vaginal cream Place 1 Applicatorful vaginally daily.   [DISCONTINUED] PREMARIN vaginal cream Place 1 g vaginally once a week.   [DISCONTINUED] zolpidem (AMBIEN CR) 12.5 MG CR tablet Take 12.5 mg by mouth at bedtime.   [DISCONTINUED] zolpidem (AMBIEN CR) 6.25 MG CR tablet Take by mouth.   No facility-administered medications prior to visit.   No Known  Allergies  Immunization History  Administered Date(s) Administered   PPD Test 04/17/2020, 04/21/2020    Health Maintenance  Topic Date Due   HIV Screening  Never done   Hepatitis C Screening  Never done   DTaP/Tdap/Td (1 - Tdap) Never done   Pneumococcal Vaccine (1 of 2 - PCV) Never done   Hepatitis B Vaccines 19-59 Average Risk (1 of 3 - 19+ 3-dose series) Never done   Cervical Cancer Screening (HPV/Pap Cotest)  11/13/2020   Influenza Vaccine  Never done   COVID-19 Vaccine (1 - 2024-25 season) Never done   Colonoscopy  10/27/2027   HPV VACCINES  Aged Out   Meningococcal B Vaccine  Aged Out    Patient Care Team: Montine Hight A, MD as PCP - General (Family Medicine)  Depression Screen    08/20/2024    8:46 AM  PHQ 2/9 Scores  PHQ - 2 Score 0  PHQ- 9 Score 4     Parris DELENA Juneau, MD  [facility] [phone] (phone) [phone] (fax)  [facility]

## 2024-08-21 LAB — COMPREHENSIVE METABOLIC PANEL WITH GFR
AG Ratio: 1.6 (calc) (ref 1.0–2.5)
ALT: 17 U/L (ref 6–29)
AST: 20 U/L (ref 10–35)
Albumin: 4.2 g/dL (ref 3.6–5.1)
Alkaline phosphatase (APISO): 83 U/L (ref 31–125)
BUN: 14 mg/dL (ref 7–25)
CO2: 27 mmol/L (ref 20–32)
Calcium: 9.7 mg/dL (ref 8.6–10.2)
Chloride: 104 mmol/L (ref 98–110)
Creat: 0.72 mg/dL (ref 0.50–0.99)
Globulin: 2.6 g/dL (ref 1.9–3.7)
Glucose, Bld: 92 mg/dL (ref 65–99)
Potassium: 4.7 mmol/L (ref 3.5–5.3)
Sodium: 138 mmol/L (ref 135–146)
Total Bilirubin: 0.2 mg/dL (ref 0.2–1.2)
Total Protein: 6.8 g/dL (ref 6.1–8.1)
eGFR: 102 mL/min/1.73m2 (ref >=60)

## 2024-08-21 LAB — CBC WITH DIFFERENTIAL/PLATELET
Absolute Lymphocytes: 2346 {cells}/uL (ref 850–3900)
Absolute Monocytes: 530 {cells}/uL (ref 200–950)
Basophils Absolute: 88 {cells}/uL (ref 0–200)
Basophils Relative: 1.3 %
Eosinophils Absolute: 184 {cells}/uL (ref 15–500)
Eosinophils Relative: 2.7 %
HCT: 41.4 % (ref 35.0–45.0)
Hemoglobin: 13 g/dL (ref 11.7–15.5)
MCH: 27.2 pg (ref 27.0–33.0)
MCHC: 31.4 g/dL — ABNORMAL LOW (ref 32.0–36.0)
MCV: 86.6 fL (ref 80.0–100.0)
MPV: 9.6 fL (ref 7.5–12.5)
Monocytes Relative: 7.8 %
Neutro Abs: 3652 {cells}/uL (ref 1500–7800)
Neutrophils Relative %: 53.7 %
Platelets: 480 Thousand/uL — ABNORMAL HIGH (ref 140–400)
RBC: 4.78 Million/uL (ref 3.80–5.10)
RDW: 20.6 % — ABNORMAL HIGH (ref 11.0–15.0)
Total Lymphocyte: 34.5 %
WBC: 6.8 Thousand/uL (ref 3.8–10.8)

## 2024-08-21 LAB — URINALYSIS, ROUTINE W REFLEX MICROSCOPIC
Bilirubin Urine: NEGATIVE
Glucose, UA: NEGATIVE
Hgb urine dipstick: NEGATIVE
Ketones, ur: NEGATIVE
Leukocytes,Ua: NEGATIVE
Nitrite: NEGATIVE
Protein, ur: NEGATIVE
Specific Gravity, Urine: 1.019 (ref 1.001–1.035)
pH: 5.5 (ref 5.0–8.0)

## 2024-08-21 LAB — LIPID PANEL
Cholesterol: 212 mg/dL — ABNORMAL HIGH (ref <200)
HDL: 62 mg/dL (ref >=50)
LDL Cholesterol (Calc): 134 mg/dL — ABNORMAL HIGH
Non-HDL Cholesterol (Calc): 150 mg/dL — ABNORMAL HIGH (ref <130)
Total CHOL/HDL Ratio: 3.4 (calc) (ref <5.0)
Triglycerides: 66 mg/dL (ref <150)

## 2024-08-21 LAB — HEMOGLOBIN A1C
Hgb A1c MFr Bld: 5.3 % (ref <5.7)
Mean Plasma Glucose: 105 mg/dL
eAG (mmol/L): 5.8 mmol/L

## 2024-09-17 ENCOUNTER — Ambulatory Visit (INDEPENDENT_AMBULATORY_CARE_PROVIDER_SITE_OTHER)

## 2024-09-17 VITALS — BP 124/80 | HR 61 | Ht 60.0 in | Wt 135.0 lb

## 2024-09-17 DIAGNOSIS — I158 Other secondary hypertension: Secondary | ICD-10-CM | POA: Diagnosis not present

## 2024-09-17 DIAGNOSIS — D75839 Thrombocytosis, unspecified: Secondary | ICD-10-CM | POA: Diagnosis not present

## 2024-09-17 DIAGNOSIS — Z23 Encounter for immunization: Secondary | ICD-10-CM

## 2024-09-17 DIAGNOSIS — G4700 Insomnia, unspecified: Secondary | ICD-10-CM | POA: Diagnosis not present

## 2024-09-17 DIAGNOSIS — L732 Hidradenitis suppurativa: Secondary | ICD-10-CM

## 2024-09-17 DIAGNOSIS — Z79890 Hormone replacement therapy (postmenopausal): Secondary | ICD-10-CM | POA: Diagnosis not present

## 2024-09-17 NOTE — Progress Notes (Signed)
 Progress Note  Physician: Sihaam Chrobak A Jelena Malicoat, MD   HPI: Carrie [name] is a 48 y.o. female presenting on 09/17/2024 for Follow-up .  Discussed the use of AI scribe software for clinical note transcription with the patient, who gave verbal consent to proceed.  History of Present Illness   Carrie [name] is a 48 year old female who presents for follow-up and management of her chronic conditions.  Lower extremity and foot pain - Aching pain in legs and feet, particularly around bedtime - Symptoms persist despite walking and other exercise - No current use of multivitamin  Recurrent cutaneous abscess- likely hidradenitis suppurivita - Recurrent cysts in multiple locations including behind ears, under eye, and between legs, armpits - Cysts have required I/D in the past - Large cyst currently present behind ear and another on eye - Onset of cysts since age 9 or 39  Thrombocytosis - Consistently elevated platelet counts for the past few years - improved from previous values.  530-->480  Gynecologic symptoms - pelvic pain - Menstrual-like cramping starting two weeks before cycle - although no menstruation - Cramping occurs even when menses does not occur - Uncertain history of uterine fibroids, previously mentioned by another physician'   - HRT started at last visit, non-related.  Tolerating without issue.  Improvement in vaginal dryness and overall energy levels  Insomnia - Improved energy levels since discontinuing Ambien.  Now on trazodone  50 - Currently taking estrogen and progesterone , resulting in improved mental well-being  History of helicobacter pylori infection - Previously treated for H. pylori infection - No current abdominal pain        Recent Results (from the past 2160 hours)  CBC with Differential/Platelet     Status: Abnormal   Collection Time: 08/20/24  9:18 AM  Result Value Ref Range   WBC 6.8 3.8 - 10.8 Thousand/uL   RBC 4.78 3.80 - 5.10  Million/uL   Hemoglobin 13.0 11.7 - 15.5 g/dL   HCT 58.5 64.9 - 54.9 %   MCV 86.6 80.0 - 100.0 fL   MCH 27.2 27.0 - 33.0 pg   MCHC 31.4 (L) 32.0 - 36.0 g/dL    Comment: For adults, a slight decrease in the calculated MCHC value (in the range of 30 to 32 g/dL) is most likely not clinically significant; however, it should be interpreted with caution in correlation with other red cell parameters and the patient's clinical condition.    RDW 20.6 (H) 11.0 - 15.0 %   Platelets 480 (H) 140 - 400 Thousand/uL   MPV 9.6 7.5 - 12.5 fL   Neutro Abs 3,652 1,500 - 7,800 cells/uL   Absolute Lymphocytes 2,346 850 - 3,900 cells/uL   Absolute Monocytes 530 200 - 950 cells/uL   Eosinophils Absolute 184 15 - 500 cells/uL   Basophils Absolute 88 0 - 200 cells/uL   Neutrophils Relative % 53.7 %   Total Lymphocyte 34.5 %   Monocytes Relative 7.8 %   Eosinophils Relative 2.7 %   Basophils Relative 1.3 %  Comprehensive metabolic panel with GFR     Status: None   Collection Time: 08/20/24  9:18 AM  Result Value Ref Range   Glucose, Bld 92 65 - 99 mg/dL    Comment: .            Fasting reference interval .    BUN 14 7 - 25 mg/dL   Creat 9.27 9.49 - 9.00 mg/dL  eGFR 102 > OR = 60 mL/min/1.91m2   BUN/Creatinine Ratio SEE NOTE: 6 - 22 (calc)    Comment:    Not Reported: BUN and Creatinine are within    reference range. .    Sodium 138 135 - 146 mmol/L   Potassium 4.7 3.5 - 5.3 mmol/L   Chloride 104 98 - 110 mmol/L   CO2 27 20 - 32 mmol/L   Calcium 9.7 8.6 - 10.2 mg/dL   Total Protein 6.8 6.1 - 8.1 g/dL   Albumin 4.2 3.6 - 5.1 g/dL   Globulin 2.6 1.9 - 3.7 g/dL (calc)   AG Ratio 1.6 1.0 - 2.5 (calc)   Total Bilirubin 0.2 0.2 - 1.2 mg/dL   Alkaline phosphatase (APISO) 83 31 - 125 U/L   AST 20 10 - 35 U/L   ALT 17 6 - 29 U/L  Hemoglobin A1c     Status: None   Collection Time: 08/20/24  9:18 AM  Result Value Ref Range   Hgb A1c MFr Bld 5.3 <5.7 %    Comment: For the purpose of screening for  the presence of diabetes: . <5.7%       Consistent with the absence of diabetes 5.7-6.4%    Consistent with increased risk for diabetes             (prediabetes) > or =6.5%  Consistent with diabetes . This assay result is consistent with a decreased risk of diabetes. . Currently, no consensus exists regarding use of hemoglobin A1c for diagnosis of diabetes in children. . According to American Diabetes Association (ADA) guidelines, hemoglobin A1c <7.0% represents optimal control in non-pregnant diabetic patients. Different metrics may apply to specific patient populations.  Standards of Medical Care in Diabetes(ADA). .    Mean Plasma Glucose 105 mg/dL   eAG (mmol/L) 5.8 mmol/L  Lipid panel     Status: Abnormal   Collection Time: 08/20/24  9:18 AM  Result Value Ref Range   Cholesterol 212 (H) <200 mg/dL   HDL 62 > OR = 50 mg/dL   Triglycerides 66 <849 mg/dL   LDL Cholesterol (Calc) 134 (H) mg/dL (calc)    Comment: Reference range: <100 . Desirable range <100 mg/dL for primary prevention;   <70 mg/dL for patients with CHD or diabetic patients  with > or = 2 CHD risk factors. SABRA LDL-C is now calculated using the Martin-Hopkins  calculation, which is a validated novel method providing  better accuracy than the Friedewald equation in the  estimation of LDL-C.  Gladis APPLETHWAITE et al. SANDREA. 7986;689(80): 2061-2068  (http://education.[facility].com/faq/FAQ164)    Total CHOL/HDL Ratio 3.4 <5.0 (calc)   Non-HDL Cholesterol (Calc) 150 (H) <130 mg/dL (calc)    Comment: For patients with diabetes plus 1 major ASCVD risk  factor, treating to a non-HDL-C goal of <100 mg/dL  (LDL-C of <29 mg/dL) is considered a therapeutic  option.   Urinalysis, Routine w reflex microscopic     Status: None   Collection Time: 08/20/24  9:18 AM  Result Value Ref Range   Color, Urine YELLOW YELLOW   APPearance CLEAR CLEAR   Specific Gravity, Urine 1.019 1.001 - 1.035   pH 5.5 5.0 - 8.0   Glucose,  UA NEGATIVE NEGATIVE   Bilirubin Urine NEGATIVE NEGATIVE   Ketones, ur NEGATIVE NEGATIVE   Hgb urine dipstick NEGATIVE NEGATIVE   Protein, ur NEGATIVE NEGATIVE   Nitrite NEGATIVE NEGATIVE   Leukocytes,Ua NEGATIVE NEGATIVE     Medical history:  Relevant past medical,  surgical, family and social history reviewed and updated as indicated. Interim medical history since our last visit reviewed.  Allergies and medications reviewed and updated.   ROS: Negative unless specifically indicated above in HPI.    Current Outpatient Medications:    albuterol  (PROVENTIL  HFA;VENTOLIN  HFA) 108 (90 Base) MCG/ACT inhaler, Inhale 2 puffs into the lungs every 6 (six) hours as needed for wheezing or shortness of breath., Disp: 1 Inhaler, Rfl: 2   amLODipine (NORVASC) 5 MG tablet, Take 5 mg by mouth daily., Disp: , Rfl:    estradiol  (CLIMARA  - DOSED IN MG/24 HR) 0.025 mg/24hr patch, Place 1 patch (0.025 mg total) onto the skin once a week., Disp: 12 patch, Rfl: 1   FEROSUL 325 (65 Fe) MG tablet, Take 325 mg by mouth daily., Disp: , Rfl:    fexofenadine -pseudoephedrine (ALLEGRA-D) 60-120 MG 12 hr tablet, Take 1 tablet by mouth 2 (two) times daily., Disp: 20 tablet, Rfl: 0   ibuprofen  (ADVIL ,MOTRIN ) 600 MG tablet, Take 1 tablet (600 mg total) by mouth every 8 (eight) hours as needed., Disp: 15 tablet, Rfl: 0   progesterone  (PROMETRIUM ) 100 MG capsule, Take 1 capsule (100 mg total) by mouth daily., Disp: 90 capsule, Rfl: 1   sertraline  (ZOLOFT ) 50 MG tablet, Take 1 tablet (50 mg total) by mouth at bedtime. Take 1/2 tab for 7 days then daily., Disp: 30 tablet, Rfl: 6   sucralfate  (CARAFATE ) 1 g tablet, Take 1 tablet (1 g total) by mouth 4 (four) times daily., Disp: 120 tablet, Rfl: 0   traZODone  (DESYREL ) 50 MG tablet, Take 0.5-1 tablets (25-50 mg total) by mouth at bedtime as needed for sleep., Disp: 30 tablet, Rfl: 3   Vitamin D, Ergocalciferol, (DRISDOL) 1.25 MG (50000 UNIT) CAPS capsule, Take 50,000 Units by  mouth once a week., Disp: , Rfl:    pantoprazole  (PROTONIX ) 20 MG tablet, Take 1 tablet (20 mg total) by mouth at bedtime., Disp: 30 tablet, Rfl: 0       Objective:     BP 124/80   Pulse 61   Ht 5' (1.524 m)   Wt 135 lb (61.2 kg)   LMP 08/09/2024 (Exact Date)   SpO2 95%   BMI 26.37 kg/m   Wt Readings from Last 3 Encounters:  09/17/24 135 lb (61.2 kg)  08/20/24 135 lb (61.2 kg)  12/22/22 134 lb (60.8 kg)    Physical Exam  Physical Exam Vitals reviewed.  Constitutional:      Appearance: Normal appearance. Well-developed with normal weight.  Cardiovascular:     Rate and Rhythm: Normal rate and regular rhythm. Normal heart sounds. Normal peripheral pulses Pulmonary:     Normal breath sounds with normal effort Skin:    General: Skin is warm and dry without noticeable rash. Neurological:     General: No focal deficit present.  Psychiatric:        Mood and Affect: Mood, behavior and cognition normal      Assessment & Plan:   Encounter Diagnoses  Name Primary?   Thrombocytosis Yes   Hormone replacement therapy (HRT)    Other secondary hypertension    Insomnia, unspecified type     No orders of the defined types were placed in this encounter.    Assessment and Plan    Hidradenitis suppurativa Chronic condition with recurrent cysts and abscesses, contributing to secondary thrombocytosis due to chronic inflammation. - Recommend quarter cup of bleach baths weekly to reduce bacterial colonization. - Consider topical antibiotics for prevention. -  Discuss potential use of doxycycline or metformin if condition flares. - Advise to contact if condition worsens or flares.  Secondary thrombocytosis - chronic elevation Elevated platelet count likely reactive due to chronic inflammation from hidradenitis suppurativa. - Recheck platelet count in spring with peripheral smear to assess platelet morphology. Recheck iron  Leg and foot pain/aching Aching pain in legs and feet,  possibly muscle cramps with no clear etiology. - Recommend magnesium glycinate supplementation. - Ensure adequate dietary calcium intake.  Dysmenorrhea Menstrual cramps starting two weeks before cycle, possible fibroids or other gynecological issues. - Refer to gynecology for evaluation, including pelvic exam and Pap smear. Ultrasound otherwise may be appropriate  Pure hypercholesterolemia Slightly elevated cholesterol levels, no family history of heart disease. - Recheck cholesterol levels in six months. - Advise dietary modifications including reducing fatty and processed foods. - Encourage regular physical activity such as walking.   I personally reviewed and interpreted the patient labs today.      Fu in March with labs

## 2024-09-21 ENCOUNTER — Other Ambulatory Visit: Payer: Self-pay

## 2024-09-21 DIAGNOSIS — L732 Hidradenitis suppurativa: Secondary | ICD-10-CM

## 2024-09-21 MED ORDER — CLINDAMYCIN PHOS (TWICE-DAILY) 1 % EX GEL: Freq: Two times a day (BID) | CUTANEOUS | 2 refills | Status: AC

## 2024-09-21 NOTE — Addendum Note (Signed)
 Addended by: ZELIA GAUZE D on: 09/21/2024 03:41 PM   Modules accepted: Orders

## 2024-10-16 DIAGNOSIS — Z803 Family history of malignant neoplasm of breast: Secondary | ICD-10-CM

## 2024-10-16 DIAGNOSIS — Z1371 Encounter for nonprocreative screening for genetic disease carrier status: Secondary | ICD-10-CM

## 2024-10-16 HISTORY — DX: Family history of malignant neoplasm of breast: Z80.3

## 2024-10-16 HISTORY — DX: Encounter for nonprocreative screening for genetic disease carrier status: Z13.71

## 2024-10-19 NOTE — Progress Notes (Unsigned)
 [name], [name], MD   No chief complaint on file.   HPI:      Carrie [name] is a 48 y.o. 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   Tobacco user 10/24/2015    Past Surgical History:  Procedure Laterality Date   CESAREAN SECTION     COLONOSCOPY WITH PROPOFOL  N/A 10/26/2020   Procedure: COLONOSCOPY WITH PROPOFOL ;  Surgeon: Therisa Bi, MD;  Location: [facility] ENDOSCOPY;  Service: Gastroenterology;  Laterality: N/A;   TUBAL LIGATION      Family History  Problem Relation Age of Onset   Diabetes Maternal Grandmother    Breast cancer Maternal Aunt    Breast cancer Maternal Aunt    Alcohol abuse Mother    Cancer Mother    Early death Mother    Early death Father    Alcohol abuse Brother    Kidney disease Sister    Ovarian cancer Neg Hx    Heart disease Neg Hx     Social History   Socioeconomic History   Marital status: Single    Spouse name: Not on file   Number of children: Not on file   Years of education: Not on file   Highest education level: Bachelor's degree (e.g., BA, AB, BS)  Occupational History   Not on file  Tobacco Use   Smoking status: Every Day    Current packs/day: 0.00    Average packs/day: 0.5 packs/day for 30.0 years (15.0 ttl pk-yrs)    Types: Cigarettes    Last attempt to quit: 02/14/2024    Years since quitting: 0.6   Smokeless tobacco: Never  Vaping Use   Vaping status: Never Used  Substance and Sexual Activity   Alcohol use: No   Drug use: No   Sexual  activity: Yes    Birth control/protection: Surgical  Other Topics Concern   Not on file  Social History Narrative   Not on file   Social Drivers of Health   Financial Resource Strain: Low Risk  (08/20/2024)   Overall Financial Resource Strain (CARDIA)    Difficulty of Paying Living Expenses: Not hard at all  Food Insecurity: No Food Insecurity (08/20/2024)   Hunger Vital Sign    Worried About Running Out of Food in the Last Year: Never true    Ran Out of Food in the Last Year: Never true  Transportation Needs: No Transportation Needs (08/20/2024)   PRAPARE - Administrator, Civil Service (Medical): No    Lack of Transportation (Non-Medical): No  Physical Activity: Sufficiently Active (08/20/2024)   Exercise Vital Sign    Days of Exercise per Week: 3 days    Minutes of Exercise per Session: 60 min  Stress: Stress Concern Present (08/20/2024)   [employer] of Occupational Health - Occupational Stress Questionnaire    Feeling of Stress: To some extent  Social Connections: Socially Integrated (08/20/2024)   Social Connection and Isolation Panel    Frequency of Communication with Friends and Family: More than three times a  week    Frequency of Social Gatherings with Friends and Family: Twice a week    Attends Religious Services: More than 4 times per year    Active Member of [employer] or Organizations: Yes    Attends Engineer, Structural: More than 4 times per year    Marital Status: Married  Catering Manager Violence: Not on file    Outpatient Medications Prior to Visit  Medication Sig Dispense Refill   albuterol  (PROVENTIL  HFA;VENTOLIN  HFA) 108 (90 Base) MCG/ACT inhaler Inhale 2 puffs into the lungs every 6 (six) hours as needed for wheezing or shortness of breath. 1 Inhaler 2   amLODipine (NORVASC) 5 MG tablet Take 5 mg by mouth daily.     clindamycin  (CLINDAGEL) 1 % gel Apply topically 2 (two) times daily. 30 g 2   estradiol  (CLIMARA  - DOSED IN MG/24 HR) 0.025  mg/24hr patch Place 1 patch (0.025 mg total) onto the skin once a week. 12 patch 1   FEROSUL 325 (65 Fe) MG tablet Take 325 mg by mouth daily.     fexofenadine -pseudoephedrine (ALLEGRA-D) 60-120 MG 12 hr tablet Take 1 tablet by mouth 2 (two) times daily. 20 tablet 0   ibuprofen  (ADVIL ,MOTRIN ) 600 MG tablet Take 1 tablet (600 mg total) by mouth every 8 (eight) hours as needed. 15 tablet 0   pantoprazole  (PROTONIX ) 20 MG tablet Take 1 tablet (20 mg total) by mouth at bedtime. 30 tablet 0   progesterone  (PROMETRIUM ) 100 MG capsule Take 1 capsule (100 mg total) by mouth daily. 90 capsule 1   sertraline  (ZOLOFT ) 50 MG tablet Take 1 tablet (50 mg total) by mouth at bedtime. Take 1/2 tab for 7 days then daily. 30 tablet 6   sucralfate  (CARAFATE ) 1 g tablet Take 1 tablet (1 g total) by mouth 4 (four) times daily. 120 tablet 0   traZODone  (DESYREL ) 50 MG tablet Take 0.5-1 tablets (25-50 mg total) by mouth at bedtime as needed for sleep. 30 tablet 3   Vitamin D, Ergocalciferol, (DRISDOL) 1.25 MG (50000 UNIT) CAPS capsule Take 50,000 Units by mouth once a week.     No facility-administered medications prior to visit.      ROS:  Review of Systems BREAST: No symptoms   OBJECTIVE:   Vitals:  There were no vitals taken for this visit.  Physical Exam  Results: No results found for this or any previous visit (from the past 24 hours).   Assessment/Plan: No diagnosis found.    No orders of the defined types were placed in this encounter.     No follow-ups on file.  [place] Carol B. Joncarlo Friberg, PA-C 10/19/2024 12:52 PM

## 2024-10-21 ENCOUNTER — Other Ambulatory Visit (HOSPITAL_COMMUNITY)
Admission: RE | Admit: 2024-10-21 | Discharge: 2024-10-21 | Disposition: A | Source: Ambulatory Visit | Attending: Obstetrics and Gynecology | Admitting: Obstetrics and Gynecology

## 2024-10-21 ENCOUNTER — Encounter: Payer: Self-pay | Admitting: Obstetrics and Gynecology

## 2024-10-21 ENCOUNTER — Ambulatory Visit (INDEPENDENT_AMBULATORY_CARE_PROVIDER_SITE_OTHER): Admitting: Obstetrics and Gynecology

## 2024-10-21 VITALS — BP 111/75 | HR 60 | Ht 60.0 in | Wt 138.0 lb

## 2024-10-21 DIAGNOSIS — Z803 Family history of malignant neoplasm of breast: Secondary | ICD-10-CM

## 2024-10-21 DIAGNOSIS — Z124 Encounter for screening for malignant neoplasm of cervix: Secondary | ICD-10-CM | POA: Insufficient documentation

## 2024-10-21 DIAGNOSIS — N852 Hypertrophy of uterus: Secondary | ICD-10-CM | POA: Diagnosis not present

## 2024-10-21 DIAGNOSIS — R102 Pelvic and perineal pain unspecified side: Secondary | ICD-10-CM | POA: Diagnosis not present

## 2024-10-21 DIAGNOSIS — Z79890 Hormone replacement therapy (postmenopausal): Secondary | ICD-10-CM

## 2024-10-21 DIAGNOSIS — Z1151 Encounter for screening for human papillomavirus (HPV): Secondary | ICD-10-CM

## 2024-10-21 DIAGNOSIS — N951 Menopausal and female climacteric states: Secondary | ICD-10-CM | POA: Diagnosis not present

## 2024-10-21 NOTE — Patient Instructions (Signed)
 I value your feedback and you entrusting [place] with your care. If you get a [facility] [place] [facility] patient survey, I would appreciate you taking the time to let us know about your experience today. Thank you! ? ? ?

## 2024-10-25 LAB — CYTOLOGY - PAP
Adequacy: ABSENT
Comment: NEGATIVE
Diagnosis: NEGATIVE
High risk HPV: NEGATIVE

## 2024-10-28 ENCOUNTER — Ambulatory Visit (INDEPENDENT_AMBULATORY_CARE_PROVIDER_SITE_OTHER)

## 2024-10-28 DIAGNOSIS — N852 Hypertrophy of uterus: Secondary | ICD-10-CM

## 2024-10-28 DIAGNOSIS — R102 Pelvic and perineal pain unspecified side: Secondary | ICD-10-CM

## 2024-11-01 ENCOUNTER — Encounter: Payer: Self-pay | Admitting: Obstetrics and Gynecology

## 2024-11-04 ENCOUNTER — Encounter: Payer: Self-pay | Admitting: Obstetrics and Gynecology

## 2024-11-17 ENCOUNTER — Telehealth: Payer: Self-pay

## 2024-11-17 NOTE — Telephone Encounter (Signed)
 Patient called requesting to speak with someone about her genetic testing results.

## 2024-11-22 NOTE — Telephone Encounter (Signed)
 LMTRC

## 2024-11-22 NOTE — Telephone Encounter (Signed)
 Pt aware of neg MyRisk results except GALNT12 VUS. IBIS=14.5%/riskscore=14.3%. No increased screening options recommended.   Patient understands these results only apply to her and her children, and this is not indicative of genetic testing results of her other family members. It is recommended that her other family members have genetic testing done.  Pt also understands negative genetic testing doesn't mean she will never get any of these cancers.  Pt has hard copy of results through [facility] portal. F/u prn.

## 2025-02-15 ENCOUNTER — Ambulatory Visit
